# Patient Record
Sex: Female | Born: 1970
Health system: Southern US, Community
[De-identification: ages and names within clinical notes are randomized; demographics above are authoritative.]

## PROBLEM LIST (undated history)

## (undated) HISTORY — PX: ADENOIDECTOMY: SUR15

## (undated) HISTORY — PX: TONSILLECTOMY: SUR1361

---

## 1998-03-08 ENCOUNTER — Inpatient Hospital Stay (HOSPITAL_COMMUNITY): Admission: AD | Admit: 1998-03-08 | Discharge: 1998-03-10 | Payer: Self-pay | Admitting: Obstetrics & Gynecology

## 1998-05-08 ENCOUNTER — Ambulatory Visit (HOSPITAL_BASED_OUTPATIENT_CLINIC_OR_DEPARTMENT_OTHER): Admission: RE | Admit: 1998-05-08 | Discharge: 1998-05-08 | Payer: Self-pay | Admitting: Otolaryngology

## 1998-07-31 ENCOUNTER — Other Ambulatory Visit: Admission: RE | Admit: 1998-07-31 | Discharge: 1998-07-31 | Payer: Self-pay | Admitting: Obstetrics & Gynecology

## 1999-08-19 ENCOUNTER — Other Ambulatory Visit: Admission: RE | Admit: 1999-08-19 | Discharge: 1999-08-19 | Payer: Self-pay | Admitting: Obstetrics & Gynecology

## 2000-09-07 ENCOUNTER — Other Ambulatory Visit: Admission: RE | Admit: 2000-09-07 | Discharge: 2000-09-07 | Payer: Self-pay | Admitting: Obstetrics & Gynecology

## 2001-09-08 ENCOUNTER — Other Ambulatory Visit: Admission: RE | Admit: 2001-09-08 | Discharge: 2001-09-08 | Payer: Self-pay | Admitting: Obstetrics & Gynecology

## 2002-09-15 ENCOUNTER — Inpatient Hospital Stay (HOSPITAL_COMMUNITY): Admission: AD | Admit: 2002-09-15 | Discharge: 2002-09-17 | Payer: Self-pay | Admitting: Obstetrics & Gynecology

## 2002-09-15 ENCOUNTER — Encounter (INDEPENDENT_AMBULATORY_CARE_PROVIDER_SITE_OTHER): Payer: Self-pay | Admitting: Specialist

## 2002-10-26 ENCOUNTER — Other Ambulatory Visit: Admission: RE | Admit: 2002-10-26 | Discharge: 2002-10-26 | Payer: Self-pay | Admitting: Obstetrics & Gynecology

## 2003-10-02 ENCOUNTER — Other Ambulatory Visit: Admission: RE | Admit: 2003-10-02 | Discharge: 2003-10-02 | Payer: Self-pay | Admitting: Obstetrics & Gynecology

## 2004-10-06 ENCOUNTER — Other Ambulatory Visit: Admission: RE | Admit: 2004-10-06 | Discharge: 2004-10-06 | Payer: Self-pay | Admitting: Obstetrics & Gynecology

## 2007-08-04 ENCOUNTER — Emergency Department (HOSPITAL_COMMUNITY): Admission: EM | Admit: 2007-08-04 | Discharge: 2007-08-04 | Payer: Self-pay | Admitting: Family Medicine

## 2010-06-27 ENCOUNTER — Other Ambulatory Visit: Payer: Self-pay | Admitting: Dermatology

## 2010-10-10 NOTE — Discharge Summary (Signed)
NAME:  Shannon Olson, Shannon Olson                   ACCOUNT NO.:  [id number]   MEDICAL RECORD NO.:  [id number]                   PATIENT TYPE:  INP   LOCATION:  9118                                 FACILITY:  [facility]   PHYSICIAN:  Gerrit Friends. Aldona Bar, M.D.                DATE OF BIRTH:  12-Jun-1970   DATE OF ADMISSION:  09/15/2002  DATE OF DISCHARGE:  09/17/2002                                 DISCHARGE SUMMARY   DISCHARGE DIAGNOSES:  1. A 37-week intrauterine pregnancy, delivered 7-pound-4-ounce female     infant, Apgars of 9 and 9.  2. Blood type O positive.  3. Chronic hypertension with superimposed mild preeclampsia.   PROCEDURES:  1. Induction of labor.  2. Normal spontaneous delivery.   SUMMARY:  This gravida 2, para 1 at 37 weeks + with known chronic  hypertension, on [place], was seen in the office on the morning of  09/15/2002 and was felt to possibly have developed superimposed  preeclampsia.  She was sent to the hospital where she arrived at  approximately 11 a.m.  At the time of admission, her cervix was 2 cm dilated  and 50% effaced with a vertex of -2 to -1 station.  She had 1+ edema.  Her  blood pressure in the office was 160/100.   Unfortunately, she was kept in triage until approximately 7:30 p.m.  She was  ultimately transferred to the labor and delivery suite and was seen by me at  7:50 p.m.  Her blood pressures remained elevated and, indeed, went up; her  average pressure was approximately 140/110.  On her side in bed, her  pressures were more normotensive.  There was no hyperreflexia, and the fetal  heart was reactive.  When examined at 7:50 p.m. on 09/15/2002, her cervix  was 3 cm, 50 to 60% effaced with a vertex of -2 to -1 station.  Amniotomy  was carried out, a pressure catheter was placed, and induction was begun.  She had great progression with a subsequent normal spontaneous delivery of a  7-pound-4-ounce female infant with Apgars of 9 and 9 shortly before 11  p.m.  on the evening of 09/15/2002.  She was delivered over an intact perineum.  Placenta was sent to pathology, labeled chronic hypertension.   Her postpartum course was complicated by persistent elevations of her blood  pressure.  On the morning of 09/16/2002, her pressure was 160/100.  Her CBC  revealed a hemoglobin of 12, a white count of 16,700, and a platelet count  of 159,000.  She was started on labetalol; she was given a 300-mg loading  dose followed by 200 mg every eight hours orally.  On the morning of  09/17/2002, her pressures were normal, she was breast feeding without  difficulty, she was ambulating without difficulty, she was requiring minimal  medication for discomfort, her vital signs were stable, and she was deemed  ready for discharge.  Accordingly, she was given  all of her instructions and  was discharged to home.   DISCHARGE MEDICATIONS:  Labetalol 200 mg twice daily.   FOLLOW UP:  Patient will return to the office in approximately two weeks for  a blood pressure check.  As mentioned, she was given all appropriate  instructions at the time of discharge and understands all instructions well.  Regular postpartum visit will be carried out in four weeks' time.    PLAN:  Patient will continue on her prenatal vitamins as long as she is  breast feeding and will use ibuprofen over the counter for discomfort.   CONDITION ON DISCHARGE:  Improved.                                               Gerrit Friends. Aldona Bar, M.D.    RMW/MEDQ  D:  09/17/2002  T:  09/17/2002  Job:  [id number]

## 2011-05-04 ENCOUNTER — Other Ambulatory Visit (HOSPITAL_COMMUNITY): Payer: Self-pay | Admitting: Family Medicine

## 2011-05-04 DIAGNOSIS — IMO0002 Reserved for concepts with insufficient information to code with codable children: Secondary | ICD-10-CM

## 2011-05-07 ENCOUNTER — Ambulatory Visit (HOSPITAL_COMMUNITY)
Admission: RE | Admit: 2011-05-07 | Discharge: 2011-05-07 | Disposition: A | Discharge status: Home / Self Care | Payer: 59 | Source: Ambulatory Visit | Attending: Family Medicine | Admitting: Family Medicine

## 2011-05-07 DIAGNOSIS — IMO0002 Reserved for concepts with insufficient information to code with codable children: Secondary | ICD-10-CM

## 2011-05-07 DIAGNOSIS — R109 Unspecified abdominal pain: Secondary | ICD-10-CM | POA: Insufficient documentation

## 2012-05-03 ENCOUNTER — Other Ambulatory Visit: Payer: Self-pay | Admitting: Obstetrics & Gynecology

## 2013-07-03 ENCOUNTER — Other Ambulatory Visit: Payer: Self-pay | Admitting: Obstetrics & Gynecology

## 2013-07-11 ENCOUNTER — Other Ambulatory Visit: Payer: Self-pay | Admitting: Obstetrics & Gynecology

## 2013-07-11 DIAGNOSIS — R928 Other abnormal and inconclusive findings on diagnostic imaging of breast: Secondary | ICD-10-CM

## 2013-07-21 ENCOUNTER — Ambulatory Visit
Admission: RE | Admit: 2013-07-21 | Discharge: 2013-07-21 | Disposition: A | Discharge status: Home / Self Care | Payer: 59 | Source: Ambulatory Visit | Attending: Obstetrics & Gynecology | Admitting: Obstetrics & Gynecology

## 2013-07-21 DIAGNOSIS — R928 Other abnormal and inconclusive findings on diagnostic imaging of breast: Secondary | ICD-10-CM

## 2014-06-06 ENCOUNTER — Other Ambulatory Visit: Payer: Self-pay

## 2014-06-06 DIAGNOSIS — Z1231 Encounter for screening mammogram for malignant neoplasm of breast: Secondary | ICD-10-CM

## 2014-07-04 ENCOUNTER — Ambulatory Visit
Admission: RE | Admit: 2014-07-04 | Discharge: 2014-07-04 | Disposition: A | Discharge status: Home / Self Care | Payer: 59 | Source: Ambulatory Visit

## 2014-07-04 ENCOUNTER — Other Ambulatory Visit: Payer: Self-pay

## 2014-07-04 DIAGNOSIS — Z1231 Encounter for screening mammogram for malignant neoplasm of breast: Secondary | ICD-10-CM

## 2015-06-12 ENCOUNTER — Other Ambulatory Visit: Payer: Self-pay

## 2015-06-12 DIAGNOSIS — Z1231 Encounter for screening mammogram for malignant neoplasm of breast: Secondary | ICD-10-CM

## 2015-07-12 ENCOUNTER — Ambulatory Visit
Admission: RE | Admit: 2015-07-12 | Discharge: 2015-07-12 | Disposition: A | Discharge status: Home / Self Care | Payer: 59 | Source: Ambulatory Visit

## 2015-07-12 DIAGNOSIS — Z1231 Encounter for screening mammogram for malignant neoplasm of breast: Secondary | ICD-10-CM

## 2015-08-05 ENCOUNTER — Other Ambulatory Visit: Payer: Self-pay | Admitting: Obstetrics & Gynecology

## 2015-08-05 DIAGNOSIS — Z6822 Body mass index (BMI) 22.0-22.9, adult: Secondary | ICD-10-CM | POA: Diagnosis not present

## 2015-08-05 DIAGNOSIS — Z01419 Encounter for gynecological examination (general) (routine) without abnormal findings: Secondary | ICD-10-CM | POA: Diagnosis not present

## 2015-08-05 DIAGNOSIS — Z124 Encounter for screening for malignant neoplasm of cervix: Secondary | ICD-10-CM | POA: Diagnosis not present

## 2015-08-06 LAB — CYTOLOGY - PAP

## 2015-08-16 MED FILL — LISINOPRIL-HCTZ 20-12.5 MG: 20-12.5 | 90 days supply | Qty: 90 | Fill #3

## 2015-10-18 DIAGNOSIS — R05 Cough: Secondary | ICD-10-CM | POA: Diagnosis not present

## 2015-10-18 DIAGNOSIS — J309 Allergic rhinitis, unspecified: Secondary | ICD-10-CM | POA: Diagnosis not present

## 2015-10-18 DIAGNOSIS — I10 Essential (primary) hypertension: Secondary | ICD-10-CM | POA: Diagnosis not present

## 2015-10-18 MED FILL — PROMETHAZINE-DM SYRUP: 6.25-15 | 6 days supply | Qty: 120 | Fill #0

## 2015-10-18 MED FILL — MONTELUKAST SOD 10 MG TAB: 10 | 30 days supply | Qty: 30 | Fill #0

## 2015-11-13 MED FILL — LISINOPRIL-HCTZ 20-12.5 MG: 20-12.5 | 90 days supply | Qty: 90 | Fill #0

## 2016-02-21 MED FILL — LISINOPRIL-HCTZ 20-12.5 MG: 20-12.5 | 90 days supply | Qty: 90 | Fill #1

## 2016-05-12 MED FILL — LISINOPRIL-HCTZ 20-12.5 MG: 20-12.5 | 90 days supply | Qty: 90 | Fill #2

## 2016-06-03 DIAGNOSIS — D485 Neoplasm of uncertain behavior of skin: Secondary | ICD-10-CM | POA: Diagnosis not present

## 2016-06-03 DIAGNOSIS — D2271 Melanocytic nevi of right lower limb, including hip: Secondary | ICD-10-CM | POA: Diagnosis not present

## 2016-06-03 DIAGNOSIS — Z86018 Personal history of other benign neoplasm: Secondary | ICD-10-CM | POA: Diagnosis not present

## 2016-06-03 DIAGNOSIS — L821 Other seborrheic keratosis: Secondary | ICD-10-CM | POA: Diagnosis not present

## 2016-06-03 DIAGNOSIS — Z85828 Personal history of other malignant neoplasm of skin: Secondary | ICD-10-CM | POA: Diagnosis not present

## 2016-06-03 DIAGNOSIS — L814 Other melanin hyperpigmentation: Secondary | ICD-10-CM | POA: Diagnosis not present

## 2016-06-03 DIAGNOSIS — D1801 Hemangioma of skin and subcutaneous tissue: Secondary | ICD-10-CM | POA: Diagnosis not present

## 2016-06-03 DIAGNOSIS — D225 Melanocytic nevi of trunk: Secondary | ICD-10-CM | POA: Diagnosis not present

## 2016-06-23 ENCOUNTER — Telehealth: Payer: 59 | Admitting: Nurse Practitioner

## 2016-06-23 DIAGNOSIS — N3001 Acute cystitis with hematuria: Secondary | ICD-10-CM

## 2016-06-23 MED ORDER — NITROFURANTOIN MONOHYD MACRO 100 MG PO CAPS: 100.0000 mg | ORAL_CAPSULE | Freq: Two times a day (BID) | ORAL | 0 refills | Status: DC

## 2016-06-23 NOTE — Progress Notes (Signed)

## 2016-08-10 ENCOUNTER — Other Ambulatory Visit: Payer: Self-pay | Admitting: Obstetrics & Gynecology

## 2016-08-10 DIAGNOSIS — Z1231 Encounter for screening mammogram for malignant neoplasm of breast: Secondary | ICD-10-CM | POA: Diagnosis not present

## 2016-08-10 DIAGNOSIS — Z124 Encounter for screening for malignant neoplasm of cervix: Secondary | ICD-10-CM | POA: Diagnosis not present

## 2016-08-10 DIAGNOSIS — Z01419 Encounter for gynecological examination (general) (routine) without abnormal findings: Secondary | ICD-10-CM | POA: Diagnosis not present

## 2016-08-11 LAB — CYTOLOGY - PAP

## 2016-08-13 MED FILL — LISINOPRIL-HCTZ 20-12.5 MG: 20-12.5 | 90 days supply | Qty: 90 | Fill #3

## 2016-10-21 DIAGNOSIS — Z13220 Encounter for screening for lipoid disorders: Secondary | ICD-10-CM | POA: Diagnosis not present

## 2016-10-21 DIAGNOSIS — J309 Allergic rhinitis, unspecified: Secondary | ICD-10-CM | POA: Diagnosis not present

## 2016-10-21 DIAGNOSIS — I10 Essential (primary) hypertension: Secondary | ICD-10-CM | POA: Diagnosis not present

## 2016-10-21 DIAGNOSIS — Z23 Encounter for immunization: Secondary | ICD-10-CM | POA: Diagnosis not present

## 2016-11-19 MED FILL — LISINOPRIL-HCTZ 20-12.5 MG: 20-12.5 | 90 days supply | Qty: 90 | Fill #0

## 2017-01-11 ENCOUNTER — Telehealth: Payer: 59 | Admitting: Family

## 2017-01-11 DIAGNOSIS — J019 Acute sinusitis, unspecified: Secondary | ICD-10-CM

## 2017-01-11 MED ORDER — FLUTICASONE PROPIONATE 50 MCG/ACT NA SUSP: 2.0000 | NASAL | 6 refills | Status: DC

## 2017-01-11 MED ORDER — PREDNISONE 5 MG PO TABS: 5.0000 mg | ORAL_TABLET | ORAL | 0 refills | Status: AC

## 2017-01-11 NOTE — Progress Notes (Signed)
Thank you for the details you put in the comment boxes. Those details really help [place] take better care of you. It sounds like the infection is severe. However, nearly 90% of these infections are viral. The new evidence-based-practice we use is that if it is worsening at the 5 days point or exceeding 5 days, we will go with antibiotics, but not before that point in time. Your E-visit indicates it is approximately 3 days, but I wanted to clarify with you if any of these symptoms had been 5 days at this point. If so, it would change our course of treatment. I am treating this as viral for now, but please let us know if you are at or beyond 5 days and I will change the treatment plan.   We are sorry that you are not feeling well.  Here is how we plan to help!  Based on what you have shared with me it looks like you have sinusitis.  Sinusitis is inflammation and infection in the sinus cavities of the head.  Based on your presentation I believe you most likely have Acute Viral Sinusitis.This is an infection most likely caused by a virus. There is not specific treatment for viral sinusitis other than to help you with the symptoms until the infection runs its course.  You may use an oral decongestant such as Mucinex D or if you have glaucoma or high blood pressure use plain Mucinex. Saline nasal spray help and can safely be used as often as needed for congestion, I have prescribed: Fluticasone nasal spray two sprays in each nostril twice a day   I am also prescribing a prednisone pack (5mg  6 day taper)  Some authorities believe that zinc sprays or the use of Echinacea may shorten the course of your symptoms.  Sinus infections are not as easily transmitted as other respiratory infection, however we still recommend that you avoid close contact with loved ones, especially the very young and elderly.  Remember to wash your hands thoroughly throughout the day as this is the number one way to prevent the spread of  infection!  Home Care:  Only take medications as instructed by your medical team.  Complete the entire course of an antibiotic.  Do not take these medications with alcohol.  A steam or ultrasonic humidifier can help congestion.  You can place a towel over your head and breathe in the steam from hot water coming from a faucet.  Avoid close contacts especially the very young and the elderly.  Cover your mouth when you cough or sneeze.  Always remember to wash your hands.  Get Help Right Away If:  You develop worsening fever or sinus pain.  You develop a severe head ache or visual changes.  Your symptoms persist after you have completed your treatment plan.  Make sure you  Understand these instructions.  Will watch your condition.  Will get help right away if you are not doing well or get worse.  Your e-visit answers were reviewed by a board certified advanced clinical practitioner to complete your personal care plan.  Depending on the condition, your plan could have included both over the counter or prescription medications.  If there is a problem please reply  once you have received a response from your provider.  Your safety is important to [place].  If you have drug allergies check your prescription carefully.    You can use MyChart to ask questions about today's visit, request a non-urgent call back,  or ask for a work or school excuse for 24 hours related to this e-Visit. If it has been greater than 24 hours you will need to follow up with your provider, or enter a new e-Visit to address those concerns.  You will get an e-mail in the next two days asking about your experience.  I hope that your e-visit has been valuable and will speed your recovery. Thank you for using e-visits.

## 2017-02-10 MED FILL — LISINOPRIL-HCTZ 20-12.5 MG: 20-12.5 | 90 days supply | Qty: 90 | Fill #1

## 2017-04-09 DIAGNOSIS — H5213 Myopia, bilateral: Secondary | ICD-10-CM | POA: Diagnosis not present

## 2017-05-06 MED FILL — LISINOPRIL-HCTZ 20-12.5 MG: 20-12.5 | 90 days supply | Qty: 90 | Fill #2

## 2017-06-09 DIAGNOSIS — Z85828 Personal history of other malignant neoplasm of skin: Secondary | ICD-10-CM | POA: Diagnosis not present

## 2017-06-09 DIAGNOSIS — L814 Other melanin hyperpigmentation: Secondary | ICD-10-CM | POA: Diagnosis not present

## 2017-06-09 DIAGNOSIS — D1801 Hemangioma of skin and subcutaneous tissue: Secondary | ICD-10-CM | POA: Diagnosis not present

## 2017-06-09 DIAGNOSIS — Z23 Encounter for immunization: Secondary | ICD-10-CM | POA: Diagnosis not present

## 2017-06-09 DIAGNOSIS — L821 Other seborrheic keratosis: Secondary | ICD-10-CM | POA: Diagnosis not present

## 2017-06-09 DIAGNOSIS — Z86018 Personal history of other benign neoplasm: Secondary | ICD-10-CM | POA: Diagnosis not present

## 2017-06-09 DIAGNOSIS — D225 Melanocytic nevi of trunk: Secondary | ICD-10-CM | POA: Diagnosis not present

## 2017-08-06 DIAGNOSIS — I10 Essential (primary) hypertension: Secondary | ICD-10-CM | POA: Diagnosis not present

## 2017-08-06 MED FILL — LISINOPRIL-HCTZ 20-12.5 MG: 20-12.5 | 45 days supply | Qty: 90 | Fill #0

## 2017-08-17 DIAGNOSIS — Z01419 Encounter for gynecological examination (general) (routine) without abnormal findings: Secondary | ICD-10-CM | POA: Diagnosis not present

## 2017-08-17 DIAGNOSIS — Z1231 Encounter for screening mammogram for malignant neoplasm of breast: Secondary | ICD-10-CM | POA: Diagnosis not present

## 2017-09-03 DIAGNOSIS — I10 Essential (primary) hypertension: Secondary | ICD-10-CM | POA: Diagnosis not present

## 2017-09-03 DIAGNOSIS — J309 Allergic rhinitis, unspecified: Secondary | ICD-10-CM | POA: Diagnosis not present

## 2017-09-03 DIAGNOSIS — F430 Acute stress reaction: Secondary | ICD-10-CM | POA: Diagnosis not present

## 2017-09-03 MED FILL — MONTELUKAST SOD 10 MG TAB: 10 | 90 days supply | Qty: 90 | Fill #0

## 2017-10-01 DIAGNOSIS — I10 Essential (primary) hypertension: Secondary | ICD-10-CM | POA: Diagnosis not present

## 2017-10-01 DIAGNOSIS — J309 Allergic rhinitis, unspecified: Secondary | ICD-10-CM | POA: Diagnosis not present

## 2017-10-01 DIAGNOSIS — F430 Acute stress reaction: Secondary | ICD-10-CM | POA: Diagnosis not present

## 2017-10-27 MED FILL — LISINOPRIL-HCTZ 20-12.5 MG: 20-12.5 | 45 days supply | Qty: 90 | Fill #1

## 2018-01-12 DIAGNOSIS — M542 Cervicalgia: Secondary | ICD-10-CM | POA: Diagnosis not present

## 2018-01-12 DIAGNOSIS — M79601 Pain in right arm: Secondary | ICD-10-CM | POA: Diagnosis not present

## 2018-01-19 DIAGNOSIS — M79601 Pain in right arm: Secondary | ICD-10-CM | POA: Diagnosis not present

## 2018-01-19 DIAGNOSIS — M542 Cervicalgia: Secondary | ICD-10-CM | POA: Diagnosis not present

## 2018-01-26 DIAGNOSIS — M542 Cervicalgia: Secondary | ICD-10-CM | POA: Diagnosis not present

## 2018-01-26 DIAGNOSIS — M79601 Pain in right arm: Secondary | ICD-10-CM | POA: Diagnosis not present

## 2018-02-10 MED FILL — AMLODIPINE BESYLATE 5 MG TA: 5 | 30 days supply | Qty: 30 | Fill #0

## 2018-02-10 MED FILL — LISINOPRIL-HCTZ 20-12.5 MG: 20-12.5 | 45 days supply | Qty: 90 | Fill #2

## 2018-02-16 DIAGNOSIS — M79601 Pain in right arm: Secondary | ICD-10-CM | POA: Diagnosis not present

## 2018-02-16 DIAGNOSIS — M542 Cervicalgia: Secondary | ICD-10-CM | POA: Diagnosis not present

## 2018-03-02 DIAGNOSIS — M542 Cervicalgia: Secondary | ICD-10-CM | POA: Diagnosis not present

## 2018-03-02 DIAGNOSIS — M79601 Pain in right arm: Secondary | ICD-10-CM | POA: Diagnosis not present

## 2018-03-11 DIAGNOSIS — M542 Cervicalgia: Secondary | ICD-10-CM | POA: Diagnosis not present

## 2018-03-11 DIAGNOSIS — M79601 Pain in right arm: Secondary | ICD-10-CM | POA: Diagnosis not present

## 2018-03-22 MED FILL — AMLODIPINE BESYLATE 5 MG TA: 5 | 90 days supply | Qty: 90 | Fill #0

## 2018-04-20 DIAGNOSIS — I10 Essential (primary) hypertension: Secondary | ICD-10-CM | POA: Diagnosis not present

## 2018-04-20 DIAGNOSIS — J309 Allergic rhinitis, unspecified: Secondary | ICD-10-CM | POA: Diagnosis not present

## 2018-04-20 DIAGNOSIS — D1723 Benign lipomatous neoplasm of skin and subcutaneous tissue of right leg: Secondary | ICD-10-CM | POA: Diagnosis not present

## 2018-05-12 MED FILL — LISINOPRIL-HCTZ 20-12.5 MG: 20-12.5 | 45 days supply | Qty: 90 | Fill #3

## 2018-05-27 DIAGNOSIS — D225 Melanocytic nevi of trunk: Secondary | ICD-10-CM | POA: Diagnosis not present

## 2018-05-27 DIAGNOSIS — Z85828 Personal history of other malignant neoplasm of skin: Secondary | ICD-10-CM | POA: Diagnosis not present

## 2018-05-27 DIAGNOSIS — L814 Other melanin hyperpigmentation: Secondary | ICD-10-CM | POA: Diagnosis not present

## 2018-05-27 DIAGNOSIS — B078 Other viral warts: Secondary | ICD-10-CM | POA: Diagnosis not present

## 2018-05-27 DIAGNOSIS — L821 Other seborrheic keratosis: Secondary | ICD-10-CM | POA: Diagnosis not present

## 2018-05-27 DIAGNOSIS — L309 Dermatitis, unspecified: Secondary | ICD-10-CM | POA: Diagnosis not present

## 2018-05-27 DIAGNOSIS — Z86018 Personal history of other benign neoplasm: Secondary | ICD-10-CM | POA: Diagnosis not present

## 2018-05-27 DIAGNOSIS — Z23 Encounter for immunization: Secondary | ICD-10-CM | POA: Diagnosis not present

## 2018-06-23 MED FILL — AMLODIPINE BESYLATE 5 MG TA: 5 | 90 days supply | Qty: 90 | Fill #1

## 2018-08-03 MED FILL — LISINOPRIL-HCTZ 20-12.5 MG: 20-12.5 | 90 days supply | Qty: 90 | Fill #0

## 2018-08-11 MED FILL — MONTELUKAST SOD 10 MG TAB: 10 | 90 days supply | Qty: 90 | Fill #1

## 2018-10-11 DIAGNOSIS — Z01419 Encounter for gynecological examination (general) (routine) without abnormal findings: Secondary | ICD-10-CM | POA: Diagnosis not present

## 2018-10-11 DIAGNOSIS — Z124 Encounter for screening for malignant neoplasm of cervix: Secondary | ICD-10-CM | POA: Diagnosis not present

## 2018-10-11 DIAGNOSIS — Z1231 Encounter for screening mammogram for malignant neoplasm of breast: Secondary | ICD-10-CM | POA: Diagnosis not present

## 2018-10-13 ENCOUNTER — Other Ambulatory Visit: Payer: Self-pay | Admitting: Obstetrics & Gynecology

## 2018-10-13 DIAGNOSIS — R928 Other abnormal and inconclusive findings on diagnostic imaging of breast: Secondary | ICD-10-CM

## 2018-10-20 ENCOUNTER — Ambulatory Visit
Admission: RE | Admit: 2018-10-20 | Discharge: 2018-10-20 | Disposition: A | Discharge status: Home / Self Care | Payer: 59 | Source: Ambulatory Visit | Attending: Obstetrics & Gynecology | Admitting: Obstetrics & Gynecology

## 2018-10-20 ENCOUNTER — Other Ambulatory Visit: Payer: Self-pay | Admitting: Obstetrics & Gynecology

## 2018-10-20 ENCOUNTER — Other Ambulatory Visit: Payer: Self-pay

## 2018-10-20 DIAGNOSIS — R928 Other abnormal and inconclusive findings on diagnostic imaging of breast: Secondary | ICD-10-CM

## 2018-10-20 DIAGNOSIS — R922 Inconclusive mammogram: Secondary | ICD-10-CM | POA: Diagnosis not present

## 2018-10-20 DIAGNOSIS — N6310 Unspecified lump in the right breast, unspecified quadrant: Secondary | ICD-10-CM

## 2018-10-20 DIAGNOSIS — N6459 Other signs and symptoms in breast: Secondary | ICD-10-CM | POA: Diagnosis not present

## 2018-10-26 ENCOUNTER — Ambulatory Visit
Admission: RE | Admit: 2018-10-26 | Discharge: 2018-10-26 | Disposition: A | Discharge status: Home / Self Care | Payer: 59 | Source: Ambulatory Visit | Attending: Obstetrics & Gynecology | Admitting: Obstetrics & Gynecology

## 2018-10-26 ENCOUNTER — Other Ambulatory Visit: Payer: Self-pay

## 2018-10-26 DIAGNOSIS — N6310 Unspecified lump in the right breast, unspecified quadrant: Secondary | ICD-10-CM

## 2018-10-26 DIAGNOSIS — R928 Other abnormal and inconclusive findings on diagnostic imaging of breast: Secondary | ICD-10-CM | POA: Diagnosis not present

## 2018-10-26 DIAGNOSIS — N649 Disorder of breast, unspecified: Secondary | ICD-10-CM | POA: Diagnosis not present

## 2018-11-01 MED FILL — LISINOPRIL-HCTZ 20-12.5 MG: 20-12.5 | 90 days supply | Qty: 90 | Fill #0

## 2018-11-01 MED FILL — MONTELUKAST SOD 10 MG TAB: 10 | 90 days supply | Qty: 90 | Fill #0

## 2019-01-11 DIAGNOSIS — I10 Essential (primary) hypertension: Secondary | ICD-10-CM | POA: Diagnosis not present

## 2019-01-11 DIAGNOSIS — Z13220 Encounter for screening for lipoid disorders: Secondary | ICD-10-CM | POA: Diagnosis not present

## 2019-01-11 DIAGNOSIS — J309 Allergic rhinitis, unspecified: Secondary | ICD-10-CM | POA: Diagnosis not present

## 2019-01-11 DIAGNOSIS — Z0000 Encounter for general adult medical examination without abnormal findings: Secondary | ICD-10-CM | POA: Diagnosis not present

## 2019-01-12 MED FILL — LISINOPRIL-HCTZ 20-12.5 MG: 20-12.5 | 90 days supply | Qty: 90 | Fill #0

## 2019-01-12 MED FILL — MONTELUKAST SOD 10 MG TAB: 10 | 90 days supply | Qty: 90 | Fill #0

## 2019-05-03 MED FILL — LISINOPRIL-HCTZ 20-12.5 MG: 20-12.5 | 90 days supply | Qty: 90 | Fill #1

## 2019-05-31 DIAGNOSIS — L821 Other seborrheic keratosis: Secondary | ICD-10-CM | POA: Diagnosis not present

## 2019-05-31 DIAGNOSIS — L814 Other melanin hyperpigmentation: Secondary | ICD-10-CM | POA: Diagnosis not present

## 2019-05-31 DIAGNOSIS — Z85828 Personal history of other malignant neoplasm of skin: Secondary | ICD-10-CM | POA: Diagnosis not present

## 2019-05-31 DIAGNOSIS — Z23 Encounter for immunization: Secondary | ICD-10-CM | POA: Diagnosis not present

## 2019-05-31 DIAGNOSIS — Z86018 Personal history of other benign neoplasm: Secondary | ICD-10-CM | POA: Diagnosis not present

## 2019-05-31 DIAGNOSIS — D225 Melanocytic nevi of trunk: Secondary | ICD-10-CM | POA: Diagnosis not present

## 2019-07-21 ENCOUNTER — Other Ambulatory Visit (HOSPITAL_COMMUNITY): Payer: Self-pay | Admitting: Family Medicine

## 2019-07-21 DIAGNOSIS — J309 Allergic rhinitis, unspecified: Secondary | ICD-10-CM | POA: Diagnosis not present

## 2019-07-21 DIAGNOSIS — I10 Essential (primary) hypertension: Secondary | ICD-10-CM | POA: Diagnosis not present

## 2019-07-21 DIAGNOSIS — Z1211 Encounter for screening for malignant neoplasm of colon: Secondary | ICD-10-CM | POA: Diagnosis not present

## 2019-07-21 MED FILL — AMLODIPINE BESYLATE 5 MG TA: 5 | 90 days supply | Qty: 90 | Fill #0

## 2019-07-21 MED FILL — LISINOPRIL-HCTZ 20-12.5 MG: 20-12.5 | 90 days supply | Qty: 90 | Fill #0

## 2019-07-21 MED FILL — MONTELUKAST SOD 10 MG TAB: 10 | 90 days supply | Qty: 90 | Fill #0

## 2019-10-27 MED FILL — AMLODIPINE BESYLATE 5 MG TA: 5 | 90 days supply | Qty: 90 | Fill #1

## 2019-10-27 MED FILL — LISINOPRIL-HCTZ 20-12.5 MG: 20-12.5 | 90 days supply | Qty: 90 | Fill #1

## 2019-12-12 DIAGNOSIS — Z01419 Encounter for gynecological examination (general) (routine) without abnormal findings: Secondary | ICD-10-CM | POA: Diagnosis not present

## 2019-12-12 DIAGNOSIS — Z1231 Encounter for screening mammogram for malignant neoplasm of breast: Secondary | ICD-10-CM | POA: Diagnosis not present

## 2019-12-13 DIAGNOSIS — Z1211 Encounter for screening for malignant neoplasm of colon: Secondary | ICD-10-CM | POA: Diagnosis not present

## 2020-01-15 IMAGING — MG STEREOTACTIC VACUUM ASSIST RIGHT
8 series · 8 of 8 positions shown · non-contrast
Comparison: Previous exams.
COMPARISON: Previous exams.

Addendum:
CLINICAL DATA: Small developing asymmetry with possible subtle
architectural distortion in the upper right breast at recent
mammography. No sonographic correlate.

EXAM:
RIGHT BREAST STEREOTACTIC CORE NEEDLE BIOPSY

[R (1 of 8)]
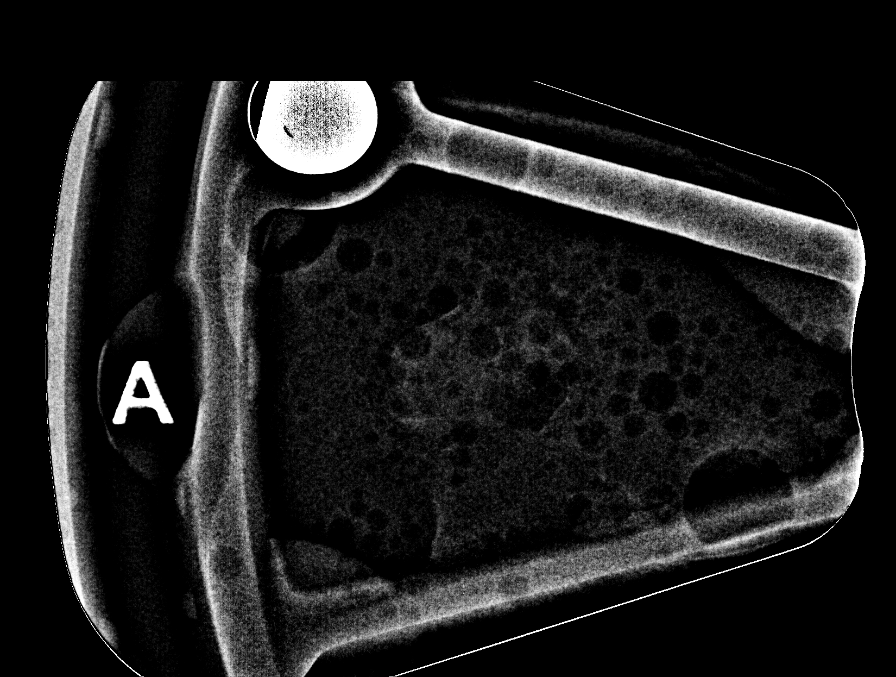

[R (2 of 8)]
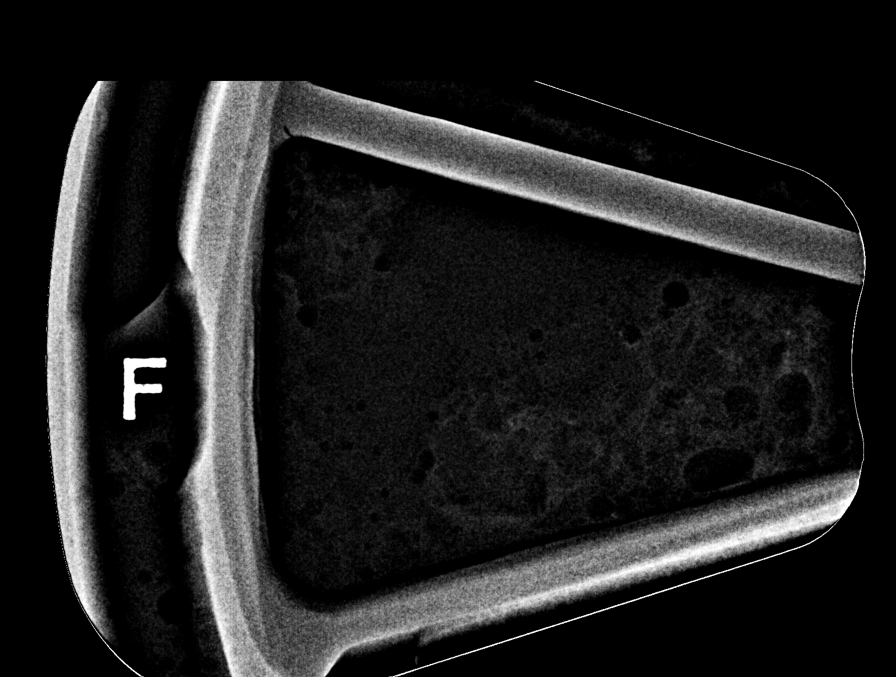

[R (3 of 8)]
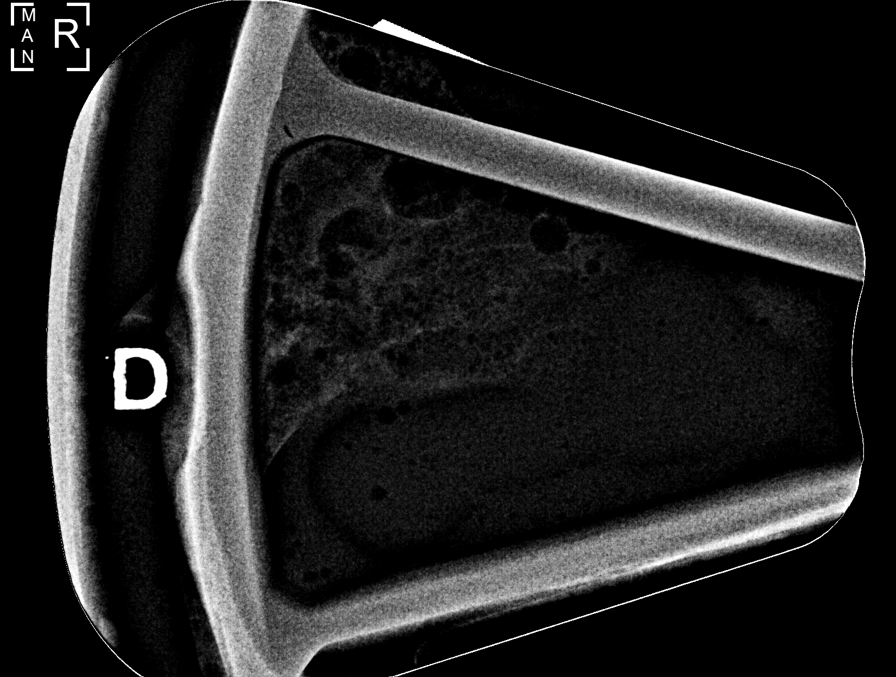

[R (4 of 8)]
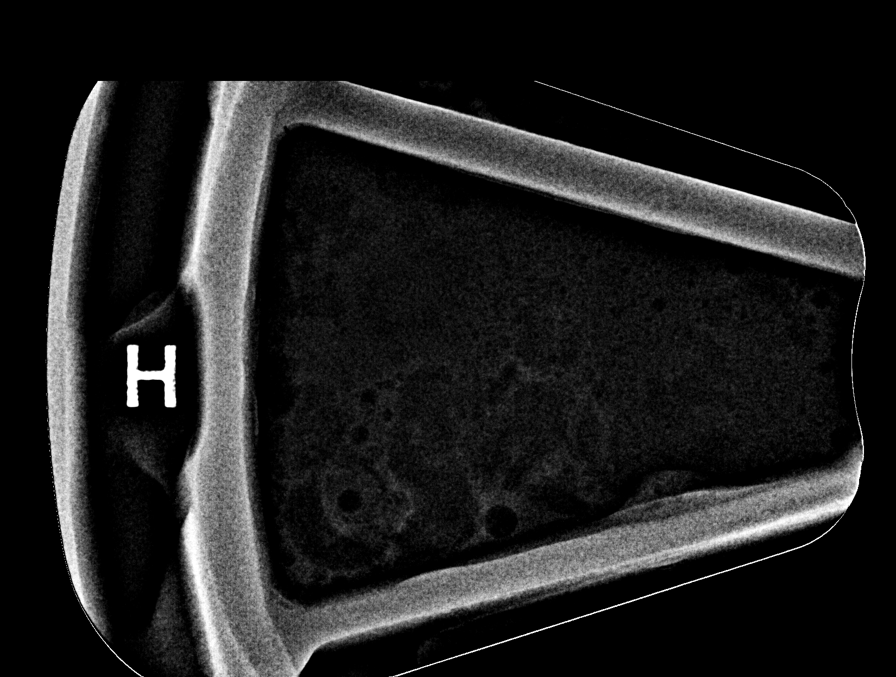

[R (5 of 8)]
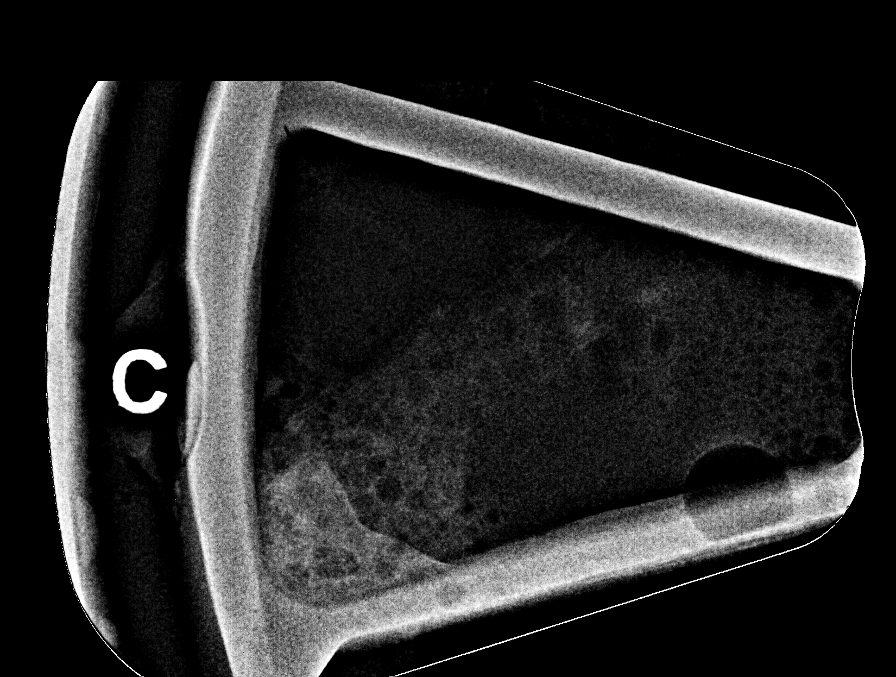

[R (6 of 8)]
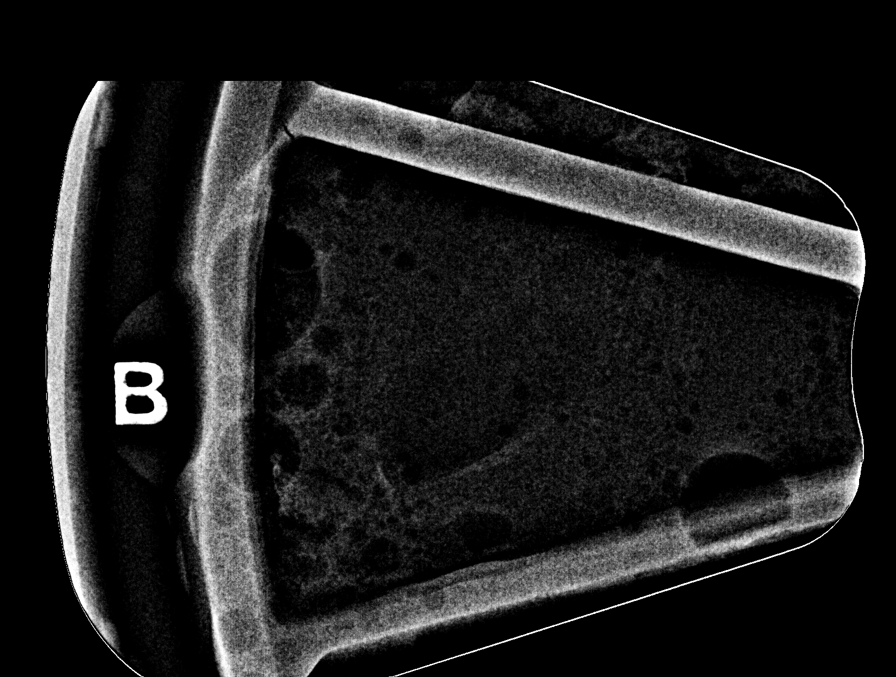

[R (7 of 8)]
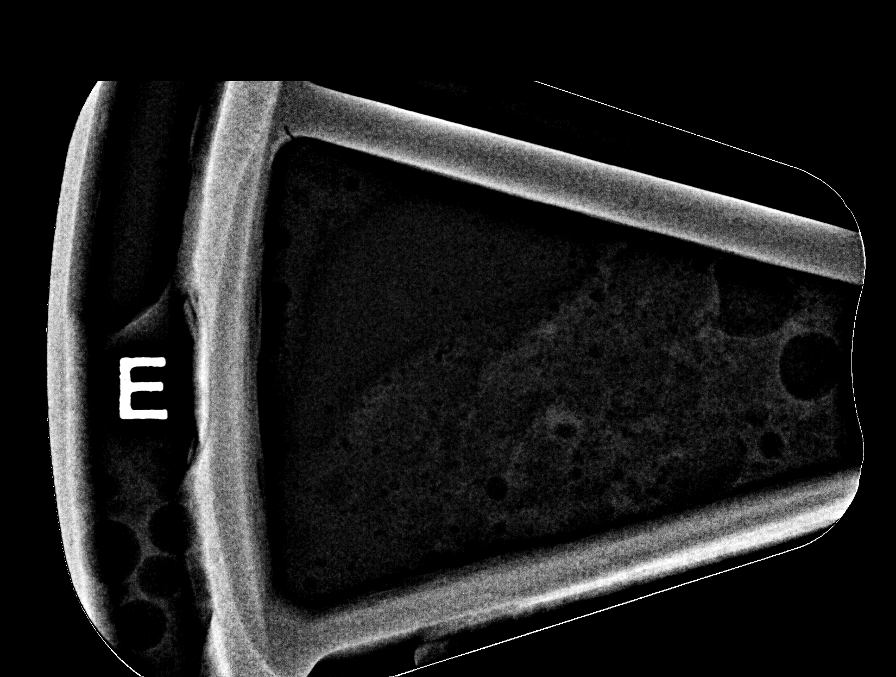

[R (8 of 8)]
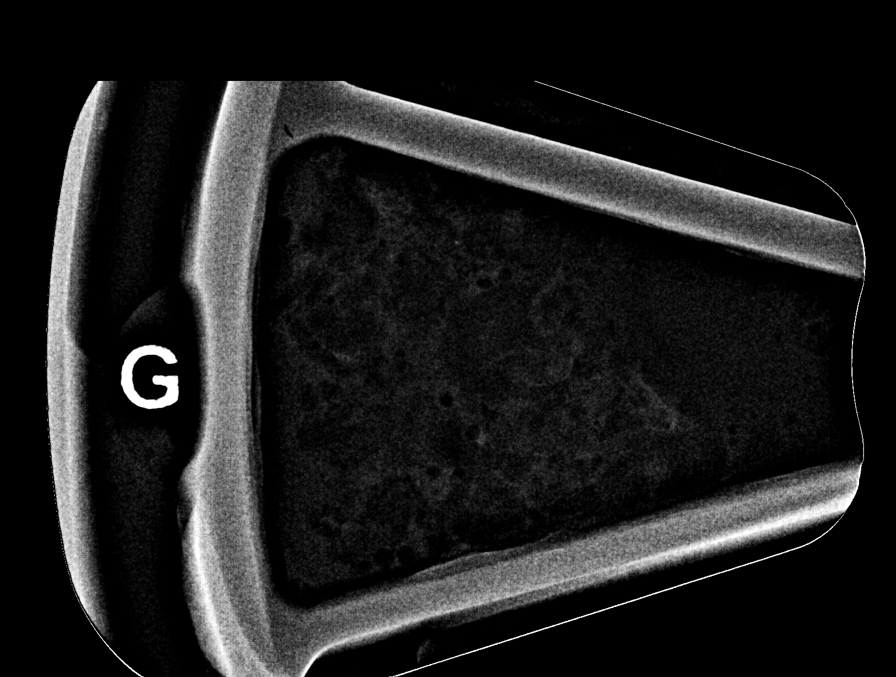

[8 of 8 positions shown; findings below may reference images not displayed]



Using sterile technique and 1% Lidocaine as local anesthetic, under
stereotactic guidance, a 9 gauge vacuum assisted device was used to
perform core needle biopsy of the recently demonstrated small
asymmetry in the upper right breast using a lateral approach.

At the conclusion of the procedure, a coil shaped tissue marker clip
was deployed into the biopsy cavity. Follow-up 2-view mammogram was
performed and dictated separately.
IMPRESSION: Stereotactic-guided biopsy of the recently demonstrated small
asymmetry in the upper right breast. No apparent complications.

ADDENDUM:
Pathology revealed BENIGN LYMPH NODE of the Right breast, upper.
This was found to be concordant by Dr. Kamedu Horsthemke.

Pathology results were discussed with the patient by telephone. The
patient reported doing well after the biopsy with tenderness and
minimal bleeding at the site. Post biopsy instructions and care were
reviewed and questions were answered. The patient was encouraged to
call The [REDACTED] for any additional
concerns.

The patient was instructed to return for annual screening
mammography at [HOSPITAL[facility].

Pathology results reported by Parcidio Pyrrait, RN on 10/27/2018.



Using sterile technique and 1% Lidocaine as local anesthetic, under
stereotactic guidance, a 9 gauge vacuum assisted device was used to
perform core needle biopsy of the recently demonstrated small
asymmetry in the upper right breast using a lateral approach.

At the conclusion of the procedure, a coil shaped tissue marker clip
was deployed into the biopsy cavity. Follow-up 2-view mammogram was
performed and dictated separately.
IMPRESSION: Stereotactic-guided biopsy of the recently demonstrated small
asymmetry in the upper right breast. No apparent complications.

## 2020-01-25 MED FILL — AMLODIPINE BESYLATE 5 MG TA: 5 | 90 days supply | Qty: 90 | Fill #2

## 2020-01-25 MED FILL — LISINOPRIL-HCTZ 20-12.5 MG: 20-12.5 | 90 days supply | Qty: 90 | Fill #2

## 2020-01-31 ENCOUNTER — Other Ambulatory Visit (HOSPITAL_COMMUNITY): Payer: Self-pay | Admitting: Family Medicine

## 2020-01-31 DIAGNOSIS — Z0000 Encounter for general adult medical examination without abnormal findings: Secondary | ICD-10-CM | POA: Diagnosis not present

## 2020-01-31 DIAGNOSIS — J309 Allergic rhinitis, unspecified: Secondary | ICD-10-CM | POA: Diagnosis not present

## 2020-01-31 DIAGNOSIS — I10 Essential (primary) hypertension: Secondary | ICD-10-CM | POA: Diagnosis not present

## 2020-01-31 DIAGNOSIS — Z13220 Encounter for screening for lipoid disorders: Secondary | ICD-10-CM | POA: Diagnosis not present

## 2020-02-14 DIAGNOSIS — M79601 Pain in right arm: Secondary | ICD-10-CM | POA: Diagnosis not present

## 2020-02-14 DIAGNOSIS — M542 Cervicalgia: Secondary | ICD-10-CM | POA: Diagnosis not present

## 2020-02-28 DIAGNOSIS — M79601 Pain in right arm: Secondary | ICD-10-CM | POA: Diagnosis not present

## 2020-02-28 DIAGNOSIS — M542 Cervicalgia: Secondary | ICD-10-CM | POA: Diagnosis not present

## 2020-03-06 DIAGNOSIS — M542 Cervicalgia: Secondary | ICD-10-CM | POA: Diagnosis not present

## 2020-03-06 DIAGNOSIS — M79601 Pain in right arm: Secondary | ICD-10-CM | POA: Diagnosis not present

## 2020-03-13 DIAGNOSIS — M79601 Pain in right arm: Secondary | ICD-10-CM | POA: Diagnosis not present

## 2020-03-13 DIAGNOSIS — M542 Cervicalgia: Secondary | ICD-10-CM | POA: Diagnosis not present

## 2020-03-22 MED FILL — MONTELUKAST SOD 10 MG TAB: 10 | 90 days supply | Qty: 90 | Fill #0

## 2020-03-27 DIAGNOSIS — M542 Cervicalgia: Secondary | ICD-10-CM | POA: Diagnosis not present

## 2020-03-27 DIAGNOSIS — M79601 Pain in right arm: Secondary | ICD-10-CM | POA: Diagnosis not present

## 2020-04-03 DIAGNOSIS — M79601 Pain in right arm: Secondary | ICD-10-CM | POA: Diagnosis not present

## 2020-04-03 DIAGNOSIS — M542 Cervicalgia: Secondary | ICD-10-CM | POA: Diagnosis not present

## 2020-04-16 MED FILL — LISINOPRIL-HCTZ 20-12.5 MG: 20-12.5 | 90 days supply | Qty: 90 | Fill #3

## 2020-04-17 DIAGNOSIS — M542 Cervicalgia: Secondary | ICD-10-CM | POA: Diagnosis not present

## 2020-04-17 DIAGNOSIS — M79601 Pain in right arm: Secondary | ICD-10-CM | POA: Diagnosis not present

## 2020-04-26 MED FILL — AMLODIPINE BESYLATE 5 MG TA: 5 | 90 days supply | Qty: 90 | Fill #3

## 2020-05-01 DIAGNOSIS — M79601 Pain in right arm: Secondary | ICD-10-CM | POA: Diagnosis not present

## 2020-05-01 DIAGNOSIS — M542 Cervicalgia: Secondary | ICD-10-CM | POA: Diagnosis not present

## 2020-05-08 DIAGNOSIS — M79601 Pain in right arm: Secondary | ICD-10-CM | POA: Diagnosis not present

## 2020-05-08 DIAGNOSIS — M542 Cervicalgia: Secondary | ICD-10-CM | POA: Diagnosis not present

## 2020-05-22 DIAGNOSIS — M79601 Pain in right arm: Secondary | ICD-10-CM | POA: Diagnosis not present

## 2020-05-22 DIAGNOSIS — M542 Cervicalgia: Secondary | ICD-10-CM | POA: Diagnosis not present

## 2020-06-05 DIAGNOSIS — Z86018 Personal history of other benign neoplasm: Secondary | ICD-10-CM | POA: Diagnosis not present

## 2020-06-05 DIAGNOSIS — M79601 Pain in right arm: Secondary | ICD-10-CM | POA: Diagnosis not present

## 2020-06-05 DIAGNOSIS — L821 Other seborrheic keratosis: Secondary | ICD-10-CM | POA: Diagnosis not present

## 2020-06-05 DIAGNOSIS — Z85828 Personal history of other malignant neoplasm of skin: Secondary | ICD-10-CM | POA: Diagnosis not present

## 2020-06-05 DIAGNOSIS — M542 Cervicalgia: Secondary | ICD-10-CM | POA: Diagnosis not present

## 2020-06-05 DIAGNOSIS — L578 Other skin changes due to chronic exposure to nonionizing radiation: Secondary | ICD-10-CM | POA: Diagnosis not present

## 2020-06-05 DIAGNOSIS — L814 Other melanin hyperpigmentation: Secondary | ICD-10-CM | POA: Diagnosis not present

## 2020-06-05 DIAGNOSIS — D225 Melanocytic nevi of trunk: Secondary | ICD-10-CM | POA: Diagnosis not present

## 2020-06-14 MED FILL — MONTELUKAST SOD 10 MG TAB: 10 | 90 days supply | Qty: 90 | Fill #1

## 2020-07-18 MED FILL — LISINOPRIL-HCTZ 20-12.5 MG: 20-12.5 | 90 days supply | Qty: 90 | Fill #0

## 2020-07-18 MED FILL — AMLODIPINE BESYLATE 5 MG TA: 5 | 90 days supply | Qty: 90 | Fill #0

## 2020-07-31 ENCOUNTER — Other Ambulatory Visit (HOSPITAL_COMMUNITY): Payer: Self-pay | Admitting: Family Medicine

## 2020-07-31 DIAGNOSIS — Z23 Encounter for immunization: Secondary | ICD-10-CM | POA: Diagnosis not present

## 2020-07-31 DIAGNOSIS — I10 Essential (primary) hypertension: Secondary | ICD-10-CM | POA: Diagnosis not present

## 2020-07-31 DIAGNOSIS — R0982 Postnasal drip: Secondary | ICD-10-CM | POA: Diagnosis not present

## 2020-07-31 DIAGNOSIS — K219 Gastro-esophageal reflux disease without esophagitis: Secondary | ICD-10-CM | POA: Diagnosis not present

## 2020-07-31 DIAGNOSIS — J309 Allergic rhinitis, unspecified: Secondary | ICD-10-CM | POA: Diagnosis not present

## 2020-07-31 MED FILL — AZELASTINE HCL 137 MCG SPRY: 0.1 | 25 days supply | Qty: 30 | Fill #0

## 2020-08-14 ENCOUNTER — Other Ambulatory Visit (HOSPITAL_COMMUNITY): Payer: Self-pay | Admitting: Family Medicine

## 2020-08-14 MED FILL — LISINOPRIL-HCTZ 20-25 MG TA: 20-25 | 90 days supply | Qty: 90 | Fill #0

## 2020-09-11 ENCOUNTER — Other Ambulatory Visit (HOSPITAL_COMMUNITY): Payer: Self-pay

## 2020-09-13 ENCOUNTER — Other Ambulatory Visit (HOSPITAL_COMMUNITY): Payer: Self-pay

## 2020-09-13 MED ORDER — MONTELUKAST SODIUM 10 MG PO TABS
10.0000 mg | ORAL_TABLET | Freq: Every day | ORAL | 1 refills | Status: DC
  Filled 2020-09-13: qty 90, 90d supply, fill #0
  Filled 2020-12-26: qty 90, 90d supply, fill #1

## 2020-09-18 ENCOUNTER — Other Ambulatory Visit (HOSPITAL_COMMUNITY): Payer: Self-pay

## 2020-09-18 DIAGNOSIS — H52203 Unspecified astigmatism, bilateral: Secondary | ICD-10-CM | POA: Diagnosis not present

## 2020-09-18 DIAGNOSIS — H5213 Myopia, bilateral: Secondary | ICD-10-CM | POA: Diagnosis not present

## 2020-09-25 ENCOUNTER — Other Ambulatory Visit (HOSPITAL_COMMUNITY): Payer: Self-pay

## 2020-09-25 DIAGNOSIS — I10 Essential (primary) hypertension: Secondary | ICD-10-CM | POA: Diagnosis not present

## 2020-09-25 DIAGNOSIS — H6980 Other specified disorders of Eustachian tube, unspecified ear: Secondary | ICD-10-CM | POA: Diagnosis not present

## 2020-09-25 DIAGNOSIS — H1010 Acute atopic conjunctivitis, unspecified eye: Secondary | ICD-10-CM | POA: Diagnosis not present

## 2020-10-15 ENCOUNTER — Ambulatory Visit: Payer: 59 | Attending: Internal Medicine

## 2020-10-15 ENCOUNTER — Other Ambulatory Visit: Payer: Self-pay

## 2020-10-15 DIAGNOSIS — Z23 Encounter for immunization: Secondary | ICD-10-CM

## 2020-10-15 MED ORDER — PFIZER-BIONT COVID-19 VAC-TRIS 30 MCG/0.3ML IM SUSP
INTRAMUSCULAR | 0 refills | Status: AC
  Filled 2020-10-15: qty 0.3, 1d supply, fill #0

## 2020-10-15 NOTE — Progress Notes (Signed)
   Covid-19 Vaccination Clinic  Name:  Shannon Olson    MRN: [id number] DOB: February 01, 1971  10/15/2020  Ms. Shannon Olson was observed post Covid-19 immunization for 15 minutes without incident. She was provided with Vaccine Information Sheet and instruction to access the V-Safe system.   Ms. Shannon Olson was instructed to call 911 with any severe reactions post vaccine: Marland Kitchen Difficulty breathing  . Swelling of face and throat  . A fast heartbeat  . A bad rash all over body  . Dizziness and weakness   Immunizations Administered    Name Date Dose VIS Date Route   PFIZER Comrnaty(Gray TOP) Covid-19 Vaccine 10/15/2020  1:29 PM 0.3 mL 05/02/2020 Intramuscular   Manufacturer: [place]   Lot: [id number]   NDC: [place], PharmD, MBA Clinical Acute Care Pharmacist

## 2020-10-30 ENCOUNTER — Other Ambulatory Visit (HOSPITAL_COMMUNITY): Payer: Self-pay

## 2020-10-30 MED FILL — Amlodipine Besylate Tab 5 MG (Base Equivalent): ORAL | 90 days supply | Qty: 90 | Fill #0 | Status: AC

## 2020-10-30 MED FILL — Lisinopril & Hydrochlorothiazide Tab 20-25 MG: ORAL | 90 days supply | Qty: 90 | Fill #0 | Status: AC

## 2020-11-18 ENCOUNTER — Telehealth: Payer: 59 | Admitting: Physician Assistant

## 2020-11-18 DIAGNOSIS — R300 Dysuria: Secondary | ICD-10-CM

## 2020-11-19 ENCOUNTER — Other Ambulatory Visit (HOSPITAL_COMMUNITY): Payer: Self-pay

## 2020-11-19 MED ORDER — CEPHALEXIN 500 MG PO CAPS
500.0000 mg | ORAL_CAPSULE | Freq: Two times a day (BID) | ORAL | 0 refills | Status: AC
  Filled 2020-11-19: qty 14, 7d supply, fill #0

## 2020-11-19 NOTE — Progress Notes (Signed)

## 2020-11-19 NOTE — Progress Notes (Signed)
I have spent 5 minutes in review of e-visit questionnaire, review and updating patient chart, medical decision making and response to patient.   Quindarius Cabello Cody Plez Belton, PA-C    

## 2020-12-26 MED FILL — Azelastine HCl Nasal Spray 0.1% (137 MCG/SPRAY): NASAL | 25 days supply | Qty: 30 | Fill #0 | Status: AC

## 2020-12-27 ENCOUNTER — Other Ambulatory Visit (HOSPITAL_COMMUNITY): Payer: Self-pay

## 2021-01-28 MED FILL — Amlodipine Besylate Tab 5 MG (Base Equivalent): ORAL | 90 days supply | Qty: 90 | Fill #1 | Status: AC

## 2021-01-28 MED FILL — Lisinopril & Hydrochlorothiazide Tab 20-25 MG: ORAL | 90 days supply | Qty: 90 | Fill #1 | Status: AC

## 2021-01-29 ENCOUNTER — Other Ambulatory Visit (HOSPITAL_COMMUNITY): Payer: Self-pay

## 2021-02-05 ENCOUNTER — Other Ambulatory Visit (HOSPITAL_COMMUNITY): Payer: Self-pay

## 2021-02-05 DIAGNOSIS — Z13220 Encounter for screening for lipoid disorders: Secondary | ICD-10-CM | POA: Diagnosis not present

## 2021-02-05 DIAGNOSIS — I10 Essential (primary) hypertension: Secondary | ICD-10-CM | POA: Diagnosis not present

## 2021-02-05 DIAGNOSIS — Z0000 Encounter for general adult medical examination without abnormal findings: Secondary | ICD-10-CM | POA: Diagnosis not present

## 2021-02-05 DIAGNOSIS — J309 Allergic rhinitis, unspecified: Secondary | ICD-10-CM | POA: Diagnosis not present

## 2021-02-05 DIAGNOSIS — K219 Gastro-esophageal reflux disease without esophagitis: Secondary | ICD-10-CM | POA: Diagnosis not present

## 2021-02-05 DIAGNOSIS — Z23 Encounter for immunization: Secondary | ICD-10-CM | POA: Diagnosis not present

## 2021-02-05 DIAGNOSIS — R0982 Postnasal drip: Secondary | ICD-10-CM | POA: Diagnosis not present

## 2021-02-05 MED ORDER — AMLODIPINE BESYLATE 5 MG PO TABS
5.0000 mg | ORAL_TABLET | Freq: Every day | ORAL | 4 refills | Status: DC
  Filled 2021-02-05 – 2021-05-04 (×2): qty 90, 90d supply, fill #0
  Filled 2021-08-04: qty 90, 90d supply, fill #1
  Filled 2021-10-30: qty 90, 90d supply, fill #2
  Filled 2022-01-27: qty 90, 90d supply, fill #3

## 2021-02-05 MED ORDER — MONTELUKAST SODIUM 10 MG PO TABS
10.0000 mg | ORAL_TABLET | Freq: Every evening | ORAL | 4 refills | Status: DC
  Filled 2021-02-05 – 2021-04-02 (×2): qty 90, 90d supply, fill #0
  Filled 2021-08-04: qty 90, 90d supply, fill #1

## 2021-02-05 MED ORDER — LISINOPRIL-HYDROCHLOROTHIAZIDE 20-25 MG PO TABS
1.0000 | ORAL_TABLET | Freq: Every day | ORAL | 4 refills | Status: DC
  Filled 2021-02-05 – 2021-05-04 (×2): qty 90, 90d supply, fill #0
  Filled 2021-08-04: qty 90, 90d supply, fill #1
  Filled 2021-10-30: qty 90, 90d supply, fill #2
  Filled 2022-01-27: qty 90, 90d supply, fill #3

## 2021-02-18 DIAGNOSIS — Z124 Encounter for screening for malignant neoplasm of cervix: Secondary | ICD-10-CM | POA: Diagnosis not present

## 2021-02-18 DIAGNOSIS — Z1231 Encounter for screening mammogram for malignant neoplasm of breast: Secondary | ICD-10-CM | POA: Diagnosis not present

## 2021-02-18 DIAGNOSIS — Z01419 Encounter for gynecological examination (general) (routine) without abnormal findings: Secondary | ICD-10-CM | POA: Diagnosis not present

## 2021-04-03 ENCOUNTER — Other Ambulatory Visit (HOSPITAL_COMMUNITY): Payer: Self-pay

## 2021-05-05 ENCOUNTER — Other Ambulatory Visit: Payer: Self-pay

## 2021-06-27 DIAGNOSIS — L814 Other melanin hyperpigmentation: Secondary | ICD-10-CM | POA: Diagnosis not present

## 2021-06-27 DIAGNOSIS — L821 Other seborrheic keratosis: Secondary | ICD-10-CM | POA: Diagnosis not present

## 2021-06-27 DIAGNOSIS — D225 Melanocytic nevi of trunk: Secondary | ICD-10-CM | POA: Diagnosis not present

## 2021-06-27 DIAGNOSIS — L578 Other skin changes due to chronic exposure to nonionizing radiation: Secondary | ICD-10-CM | POA: Diagnosis not present

## 2021-06-27 DIAGNOSIS — Z86018 Personal history of other benign neoplasm: Secondary | ICD-10-CM | POA: Diagnosis not present

## 2021-06-27 DIAGNOSIS — Z85828 Personal history of other malignant neoplasm of skin: Secondary | ICD-10-CM | POA: Diagnosis not present

## 2021-06-27 DIAGNOSIS — Z23 Encounter for immunization: Secondary | ICD-10-CM | POA: Diagnosis not present

## 2021-08-05 ENCOUNTER — Other Ambulatory Visit (HOSPITAL_COMMUNITY): Payer: Self-pay

## 2021-08-05 ENCOUNTER — Other Ambulatory Visit: Payer: Self-pay

## 2021-08-06 ENCOUNTER — Other Ambulatory Visit: Payer: Self-pay | Admitting: Family Medicine

## 2021-08-06 ENCOUNTER — Other Ambulatory Visit: Payer: Self-pay

## 2021-08-06 ENCOUNTER — Ambulatory Visit
Admission: RE | Admit: 2021-08-06 | Discharge: 2021-08-06 | Disposition: A | Discharge status: Home / Self Care | Payer: 59 | Source: Ambulatory Visit | Attending: Family Medicine | Admitting: Family Medicine

## 2021-08-06 DIAGNOSIS — M7989 Other specified soft tissue disorders: Secondary | ICD-10-CM

## 2021-08-06 DIAGNOSIS — D179 Benign lipomatous neoplasm, unspecified: Secondary | ICD-10-CM | POA: Diagnosis not present

## 2021-08-06 DIAGNOSIS — J309 Allergic rhinitis, unspecified: Secondary | ICD-10-CM | POA: Diagnosis not present

## 2021-08-06 DIAGNOSIS — R0982 Postnasal drip: Secondary | ICD-10-CM | POA: Diagnosis not present

## 2021-08-06 DIAGNOSIS — I10 Essential (primary) hypertension: Secondary | ICD-10-CM | POA: Diagnosis not present

## 2021-08-06 DIAGNOSIS — K219 Gastro-esophageal reflux disease without esophagitis: Secondary | ICD-10-CM | POA: Diagnosis not present

## 2021-08-06 DIAGNOSIS — F458 Other somatoform disorders: Secondary | ICD-10-CM | POA: Diagnosis not present

## 2021-10-30 ENCOUNTER — Other Ambulatory Visit (HOSPITAL_COMMUNITY): Payer: Self-pay

## 2021-10-31 ENCOUNTER — Other Ambulatory Visit (HOSPITAL_COMMUNITY): Payer: Self-pay

## 2021-10-31 MED ORDER — MONTELUKAST SODIUM 10 MG PO TABS
10.0000 mg | ORAL_TABLET | Freq: Every evening | ORAL | 1 refills | Status: DC
  Filled 2021-10-31: qty 90, 90d supply, fill #0

## 2021-11-04 ENCOUNTER — Other Ambulatory Visit (HOSPITAL_COMMUNITY): Payer: Self-pay

## 2021-11-28 DIAGNOSIS — K219 Gastro-esophageal reflux disease without esophagitis: Secondary | ICD-10-CM | POA: Diagnosis not present

## 2021-11-28 DIAGNOSIS — R0989 Other specified symptoms and signs involving the circulatory and respiratory systems: Secondary | ICD-10-CM | POA: Diagnosis not present

## 2022-01-28 ENCOUNTER — Other Ambulatory Visit (HOSPITAL_COMMUNITY): Payer: Self-pay

## 2022-02-11 ENCOUNTER — Other Ambulatory Visit (HOSPITAL_COMMUNITY): Payer: Self-pay

## 2022-02-11 DIAGNOSIS — Z0000 Encounter for general adult medical examination without abnormal findings: Secondary | ICD-10-CM | POA: Diagnosis not present

## 2022-02-11 DIAGNOSIS — K219 Gastro-esophageal reflux disease without esophagitis: Secondary | ICD-10-CM | POA: Diagnosis not present

## 2022-02-11 DIAGNOSIS — I10 Essential (primary) hypertension: Secondary | ICD-10-CM | POA: Diagnosis not present

## 2022-02-11 DIAGNOSIS — J309 Allergic rhinitis, unspecified: Secondary | ICD-10-CM | POA: Diagnosis not present

## 2022-02-11 MED ORDER — AMLODIPINE BESYLATE 5 MG PO TABS
5.0000 mg | ORAL_TABLET | Freq: Every day | ORAL | 4 refills | Status: AC
  Filled 2022-02-11 – 2022-04-24 (×2): qty 90, 90d supply, fill #0
  Filled 2022-07-29: qty 90, 90d supply, fill #1

## 2022-02-11 MED ORDER — LISINOPRIL-HYDROCHLOROTHIAZIDE 20-25 MG PO TABS
1.0000 | ORAL_TABLET | Freq: Every day | ORAL | 4 refills | Status: AC
  Filled 2022-02-11 – 2022-04-24 (×2): qty 90, 90d supply, fill #0
  Filled 2022-07-29: qty 90, 90d supply, fill #1

## 2022-02-11 MED ORDER — MONTELUKAST SODIUM 10 MG PO TABS
10.0000 mg | ORAL_TABLET | Freq: Every evening | ORAL | 4 refills | Status: DC
  Filled 2022-02-11: qty 90, 90d supply, fill #0

## 2022-02-25 DIAGNOSIS — Z01419 Encounter for gynecological examination (general) (routine) without abnormal findings: Secondary | ICD-10-CM | POA: Diagnosis not present

## 2022-02-25 DIAGNOSIS — Z1231 Encounter for screening mammogram for malignant neoplasm of breast: Secondary | ICD-10-CM | POA: Diagnosis not present

## 2022-03-20 ENCOUNTER — Other Ambulatory Visit: Payer: Self-pay

## 2022-03-20 ENCOUNTER — Ambulatory Visit (INDEPENDENT_AMBULATORY_CARE_PROVIDER_SITE_OTHER): Payer: 59 | Admitting: Internal Medicine

## 2022-03-20 ENCOUNTER — Encounter: Payer: Self-pay | Admitting: Internal Medicine

## 2022-03-20 VITALS — BP 122/82 | HR 76 | Temp 98.2°F | Resp 20 | Ht 64.5 in | Wt 140.6 lb

## 2022-03-20 DIAGNOSIS — H1013 Acute atopic conjunctivitis, bilateral: Secondary | ICD-10-CM

## 2022-03-20 DIAGNOSIS — K219 Gastro-esophageal reflux disease without esophagitis: Secondary | ICD-10-CM | POA: Diagnosis not present

## 2022-03-20 DIAGNOSIS — J3089 Other allergic rhinitis: Secondary | ICD-10-CM | POA: Diagnosis not present

## 2022-03-20 DIAGNOSIS — J302 Other seasonal allergic rhinitis: Secondary | ICD-10-CM

## 2022-03-20 NOTE — Patient Instructions (Addendum)
Return in about 5 weeks (around 04/24/2022).   Rhinitis: - Positive skin test to: trees, cat, dust mites - Avoidance measures discussed. - Use nasal saline rinses before nose sprays such as with Neilmed Sinus Rinse.  Use distilled water.   - Use Flonase 2 sprays each nostril daily. Aim upward and outward. - Use Azelastine 1-2 sprays each nostril twice daily as needed. Aim upward and outward. - Use Zyrtec 10 mg daily.  - Use Singulair '10mg'$  daily.  Black box warning discussed.  Stop if there are any mood/behavioral changes. - For eyes, use Olopatadine or Ketotifen drops as needed.  Available over the counter, if not covered by insurance.  - Consider allergy shots as long term control of your symptoms by teaching your immune system to be more tolerant of your allergy triggers  Reflux: -Avoid lying down for at least two hours after a meal or after drinking acidic beverages, like soda, or other caffeinated beverages. This can help to prevent stomach contents from flowing back into the esophagus. -Keep your head elevated while you sleep. Using an extra pillow or two can also help to prevent reflux. -Eat smaller and more frequent meals each day instead of a few large meals. This promotes digestion and can aid in preventing heartburn. -Wear loose-fitting clothes to ease pressure on the stomach, which can worsen heartburn and reflux. -Reduce excess weight around the midsection. This can ease pressure on the stomach. Such pressure can force some stomach contents back up the esophagus.     ALLERGEN AVOIDANCE MEASURES  Dust Mites Use central air conditioning and heat; and change the filter monthly.  Pleated filters work better than mesh filters.  Electrostatic filters may also be used; wash the filter monthly.  Window air conditioners may be used, but do not clean the air as well as a central air conditioner.  Change or wash the filter monthly. Keep windows closed.  Do not use attic fans.   Encase the  mattress, box springs and pillows with zippered, dust proof covers. Wash the bed linens in hot water weekly.   Remove carpet, especially from the bedroom. Remove stuffed animals, throw pillows, dust ruffles, heavy drapes and other items that collect dust from the bedroom. Do not use a humidifier.   Use wood, vinyl or leather furniture instead of cloth furniture in the bedroom. Keep the indoor humidity at 30 - 40%.  Monitor with a humidity gauge.  Pollen Avoidance Pollen levels are highest during the mid-day and afternoon.  Consider this when planning outdoor activities. Avoid being outside when the grass is being mowed, or wear a mask if the pollen-allergic person must be the one to mow the grass. Keep the windows closed to keep pollen outside of the home. Use an air conditioner to filter the air. Take a shower, wash hair, and change clothing after working or playing outdoors during pollen season.

## 2022-03-20 NOTE — Progress Notes (Signed)
NEW PATIENT  Date of Service/Encounter:  03/23/22  Consult requested by: Gaynelle Arabian, MD   Subjective:   Shannon Olson (DOB: Feb 04, 1971) is a 51 y.o. female who presents to the clinic on 03/20/2022 with a chief complaint of rhinitis. Marland Kitchen    History obtained from: chart review and patient.   Rhinitis:  Started around age 57s Symptoms include: nasal congestion, rhinorrhea, post nasal drainage, watery eyes, and itchy eyes . Her worse symptom is post nasal drainage though.   Occurs seasonally-Fall Potential triggers: pollen Treatments tried: tried nasal sprays without complete improvement; Singulair and Claritin daily; last use of anti histamines was over 3 days ago.  Previous allergy testing: yes 20+ years ago; recalls dust mite, ragweed, cedar, cat History of chronic sinusitis or sinus surgery: no Nonallergic triggers:  cold weather    GERD: She has intermittent reflux and saw ENT 10/2021 for globus sensation and thought maybe this was reflux related so they tried a PPI for about 8 weeks. It did not change her symptoms much.  She does not have much reflux symptoms.  She does try to follow lifestyle measurements but has not been able to track down specific foods that cause her symptoms.     Past Medical History: History reviewed. No pertinent past medical history.  Past Surgical History: Past Surgical History:  Procedure Laterality Date   ADENOIDECTOMY     TONSILLECTOMY      Family History: Family History  Problem Relation Age of Onset   Breast cancer Paternal Grandmother     Social History:  Lives in a 36 year house Flooring in bedroom: carpet Pets: none Tobacco use/exposure: none Job: peds PT  Medication List:  Allergies as of 03/20/2022   No Known Allergies      Medication List        Accurate as of March 20, 2022 11:59 PM. If you have any questions, ask your nurse or doctor.          amLODipine 5 MG tablet Commonly known as:  NORVASC Take 1 tablet (5 mg total) by mouth daily for blood pressure   azelastine 0.1 % nasal spray Commonly known as: ASTELIN USE 2 SPRAYS IN EACH NOSTRIL TWICE DAILY `   fluticasone 50 MCG/ACT nasal spray Commonly known as: FLONASE Place 2 sprays into both nostrils as directed. Bid for 7 days, then once daily after this as needed for congestion   lisinopril-hydrochlorothiazide 20-25 MG tablet Commonly known as: ZESTORETIC Take 1 tablet by mouth daily for blood pressure   montelukast 10 MG tablet Commonly known as: SINGULAIR TAKE 1 TABLET BY MOUTH IN THE EVENING DAILY AS NEEDED FOR ALLERGIES What changed: Another medication with the same name was removed. Continue taking this medication, and follow the directions you see here. Changed by: Larose Kells, MD   montelukast 10 MG tablet Commonly known as: SINGULAIR Take 1 tablet (10 mg total) by mouth every evening as needed for allergies What changed: Another medication with the same name was removed. Continue taking this medication, and follow the directions you see here. Changed by: Larose Kells, MD   Pfizer-BioNT COVID-19 Vac-TriS Susp injection Generic drug: COVID-19 mRNA Vac-TriS (Pfizer) Inject into the muscle.   predniSONE 5 MG tablet Commonly known as: DELTASONE Take 1 tablet (5 mg total) by mouth as directed.         REVIEW OF SYSTEMS: Pertinent positives and negatives discussed in HPI.   Objective:   Physical Exam: BP 122/82  Pulse 76   Temp 98.2 F (36.8 C)   Resp 20   Ht 5' 4.5" (1.638 m)   Wt 140 lb 9.6 oz (63.8 kg)   SpO2 98%   BMI 23.76 kg/m  Body mass index is 23.76 kg/m. GEN: alert, well developed HEENT: clear conjunctiva, TM grey and translucent, nose with + inferior turbinate hypertrophy, pale nasal mucosa, slight clear rhinorrhea, + cobblestoning HEART: regular rate and rhythm, no murmur LUNGS: clear to auscultation bilaterally, no coughing, unlabored respiration ABDOMEN: soft, non  distended  SKIN: no rashes or lesions  Reviewed:  ENT visit 11/2021: reviewed in HPI PCP visit 01/2021 for allergic rhinitis, has tried nasal sprays and anti histamine/Singulair   Skin Testing:  Skin prick testing was placed, which includes aeroallergens/foods, histamine control, and saline control.  Verbal consent was obtained prior to placing test.  We discussed risks including anaphylaxis. Patient tolerated procedure well.  Allergy testing results were read and interpreted by myself, documented by clinical staff. Adequate positive and negative control.  Results discussed with patient/family.  Airborne Adult Perc - 03/20/22 1611     Time Antigen Placed 1541    Allergen Manufacturer Lavella Hammock    Location Arm    Number of Test 59    1. Control-Buffer 50% Glycerol Negative    2. Control-Histamine 1 mg/ml 3+    3. Albumin saline Negative    4. [place] Negative    5. [place] Negative    6. Johnson Negative    7. [place] Blue Negative    8. Meadow Fescue Negative    9. Perennial Rye Negative    10. Sweet Vernal Negative    11. Timothy Negative    12. Cocklebur Negative    13. Burweed Marshelder Negative    14. Ragweed, short Negative    15. Ragweed, Giant Negative    16. Plantain,  English Negative    17. Lamb's Quarters Negative    18. Sheep Sorrell Negative    19. Rough Pigweed Negative    20. Marsh Elder, Rough Negative    21. Mugwort, Common Negative    22. Ash mix Negative    23. Birch mix Negative    24. Beech American Negative    25. Box, Elder Negative    26. Cedar, red 4+    27. Cottonwood, [place] Negative    28. Elm mix Negative    29. Hickory Negative    30. Maple mix Negative    31. Oak, [place] mix 4+    32. Pecan Pollen Negative    33. Pine mix Negative    34. Sycamore Eastern Negative    35. White Oak, Black Pollen Negative    36. Alternaria alternata Negative    37. Cladosporium Herbarum Negative    38. Aspergillus mix Negative    39. Penicillium mix  Negative    40. Bipolaris sorokiniana (Helminthosporium) Negative    41. Drechslera spicifera (Curvularia) Negative    42. Mucor plumbeus Negative    43. Fusarium moniliforme Negative    44. Aureobasidium pullulans (pullulara) Negative    45. Rhizopus oryzae Negative    46. Botrytis cinera Negative    47. Epicoccum nigrum Negative    48. Phoma betae Negative    49. Candida Albicans Negative    50. Trichophyton mentagrophytes Negative    51. Mite, D Farinae  5,000 AU/ml Negative    52. Mite, D Pteronyssinus  5,000 AU/ml Negative    53. Cat Hair 10,000 BAU/ml 3+  54.  Dog Epithelia Negative    55. Mixed Feathers Negative    56. Horse Epithelia Negative    57. Cockroach, German Negative    58. Mouse Negative    59. Tobacco Leaf Negative             Intradermal - 03/20/22 1613     Time Antigen Placed 1553    Allergen Manufacturer Lavella Hammock    Location Arm    Number of Test 13    Control Negative    [place] Negative    Johnson Negative    7 Grass Negative    Ragweed mix Negative    Weed mix Negative    Mold 1 Negative    Mold 2 Negative    Mold 3 Negative    Mold 4 Negative    Dog Negative    Cockroach Negative    Mite mix 2+               Assessment:   1. Seasonal and perennial allergic rhinitis   2. Gastroesophageal reflux disease, unspecified whether esophagitis present   3. Allergic conjunctivitis of both eyes      Plan/Recommendations:   Allergic Rhinitis Allergic Conjunctivitis - Positive skin test 02/2022 to trees, cat, dust mites - Avoidance measures discussed. - Use nasal saline rinses before nose sprays such as with Neilmed Sinus Rinse.  Use distilled water.   - Use Flonase 2 sprays each nostril daily. Aim upward and outward. - Use Azelastine 1-2 sprays each nostril twice daily as needed. Aim upward and outward. - Use Zyrtec 10 mg daily.  - Use Singulair '10mg'$  daily.  Black box warning discussed.  Stop if there are any mood/behavioral  changes. - For eyes, use Olopatadine or Ketotifen drops as needed.  Available over the counter, if not covered by insurance.  - Consider allergy shots as long term control of your symptoms by teaching your immune system to be more tolerant of your allergy triggers  GERD: -Avoid lying down for at least two hours after a meal or after drinking acidic beverages, like soda, or other caffeinated beverages. This can help to prevent stomach contents from flowing back into the esophagus. -Keep your head elevated while you sleep. Using an extra pillow or two can also help to prevent reflux. -Eat smaller and more frequent meals each day instead of a few large meals. This promotes digestion and can aid in preventing heartburn. -Wear loose-fitting clothes to ease pressure on the stomach, which can worsen heartburn and reflux. -Reduce excess weight around the midsection. This can ease pressure on the stomach. Such pressure can force some stomach contents back up the esophagus.    Return in about 5 weeks (around 04/24/2022).  Harlon Flor, MD [facility] [place] [facility] [place]

## 2022-03-23 ENCOUNTER — Other Ambulatory Visit (HOSPITAL_COMMUNITY): Payer: Self-pay

## 2022-03-23 MED ORDER — MONTELUKAST SODIUM 10 MG PO TABS
10.0000 mg | ORAL_TABLET | Freq: Every evening | ORAL | 3 refills | Status: AC
  Filled 2022-03-23: qty 30, 30d supply, fill #0

## 2022-03-23 MED ORDER — FLUTICASONE PROPIONATE 50 MCG/ACT NA SUSP
2.0000 | Freq: Every day | NASAL | 3 refills | Status: AC
  Filled 2022-03-23 – 2022-05-11 (×2): qty 16, 30d supply, fill #0

## 2022-03-23 MED ORDER — CETIRIZINE HCL 10 MG PO TABS
10.0000 mg | ORAL_TABLET | Freq: Every day | ORAL | 3 refills | Status: AC
  Filled 2022-03-23: qty 30, 30d supply, fill #0

## 2022-03-23 MED ORDER — AZELASTINE HCL 0.1 % NA SOLN
2.0000 | Freq: Two times a day (BID) | NASAL | 3 refills | Status: AC
  Filled 2022-03-23: qty 30, 25d supply, fill #0
  Filled 2022-05-11: qty 30, 50d supply, fill #0

## 2022-03-23 MED ORDER — OLOPATADINE HCL 0.2 % OP SOLN
1.0000 [drp] | Freq: Every day | OPHTHALMIC | 3 refills | Status: AC | PRN
  Filled 2022-03-23: qty 2.5, 50d supply, fill #0

## 2022-03-31 ENCOUNTER — Other Ambulatory Visit (HOSPITAL_COMMUNITY): Payer: Self-pay

## 2022-04-08 ENCOUNTER — Other Ambulatory Visit (HOSPITAL_COMMUNITY): Payer: Self-pay

## 2022-04-24 ENCOUNTER — Other Ambulatory Visit (HOSPITAL_COMMUNITY): Payer: Self-pay

## 2022-05-01 ENCOUNTER — Ambulatory Visit: Payer: 59 | Admitting: Internal Medicine

## 2022-05-12 ENCOUNTER — Other Ambulatory Visit: Payer: Self-pay

## 2022-08-12 ENCOUNTER — Other Ambulatory Visit (HOSPITAL_COMMUNITY): Payer: Self-pay

## 2022-08-12 DIAGNOSIS — I10 Essential (primary) hypertension: Secondary | ICD-10-CM | POA: Diagnosis not present

## 2022-08-12 DIAGNOSIS — K219 Gastro-esophageal reflux disease without esophagitis: Secondary | ICD-10-CM | POA: Diagnosis not present

## 2022-08-12 DIAGNOSIS — J309 Allergic rhinitis, unspecified: Secondary | ICD-10-CM | POA: Diagnosis not present

## 2022-08-12 MED ORDER — AMLODIPINE BESYLATE 5 MG PO TABS
5.0000 mg | ORAL_TABLET | Freq: Every day | ORAL | 4 refills | Status: AC
  Filled 2022-08-12 – 2022-11-04 (×2): qty 90, 90d supply, fill #0
  Filled 2023-02-08: qty 90, 90d supply, fill #1
  Filled 2023-05-10: qty 90, 90d supply, fill #2
  Filled 2023-08-05: qty 90, 90d supply, fill #3

## 2022-08-12 MED ORDER — LISINOPRIL-HYDROCHLOROTHIAZIDE 20-25 MG PO TABS
1.0000 | ORAL_TABLET | Freq: Every day | ORAL | 4 refills | Status: AC
  Filled 2022-08-12 – 2022-11-04 (×2): qty 90, 90d supply, fill #0
  Filled 2023-02-08: qty 90, 90d supply, fill #1
  Filled 2023-05-10: qty 90, 90d supply, fill #2
  Filled 2023-08-05: qty 90, 90d supply, fill #3

## 2022-09-18 DIAGNOSIS — L578 Other skin changes due to chronic exposure to nonionizing radiation: Secondary | ICD-10-CM | POA: Diagnosis not present

## 2022-09-18 DIAGNOSIS — Z86018 Personal history of other benign neoplasm: Secondary | ICD-10-CM | POA: Diagnosis not present

## 2022-09-18 DIAGNOSIS — C44619 Basal cell carcinoma of skin of left upper limb, including shoulder: Secondary | ICD-10-CM | POA: Diagnosis not present

## 2022-09-18 DIAGNOSIS — D485 Neoplasm of uncertain behavior of skin: Secondary | ICD-10-CM | POA: Diagnosis not present

## 2022-09-18 DIAGNOSIS — D225 Melanocytic nevi of trunk: Secondary | ICD-10-CM | POA: Diagnosis not present

## 2022-09-18 DIAGNOSIS — L719 Rosacea, unspecified: Secondary | ICD-10-CM | POA: Diagnosis not present

## 2022-09-18 DIAGNOSIS — L301 Dyshidrosis [pompholyx]: Secondary | ICD-10-CM | POA: Diagnosis not present

## 2022-09-18 DIAGNOSIS — L570 Actinic keratosis: Secondary | ICD-10-CM | POA: Diagnosis not present

## 2022-09-18 DIAGNOSIS — L814 Other melanin hyperpigmentation: Secondary | ICD-10-CM | POA: Diagnosis not present

## 2022-09-18 DIAGNOSIS — Z85828 Personal history of other malignant neoplasm of skin: Secondary | ICD-10-CM | POA: Diagnosis not present

## 2022-09-18 DIAGNOSIS — L821 Other seborrheic keratosis: Secondary | ICD-10-CM | POA: Diagnosis not present

## 2022-10-14 DIAGNOSIS — C44619 Basal cell carcinoma of skin of left upper limb, including shoulder: Secondary | ICD-10-CM | POA: Diagnosis not present

## 2022-11-04 ENCOUNTER — Other Ambulatory Visit: Payer: Self-pay

## 2022-11-25 DIAGNOSIS — H52203 Unspecified astigmatism, bilateral: Secondary | ICD-10-CM | POA: Diagnosis not present

## 2022-11-25 DIAGNOSIS — H5213 Myopia, bilateral: Secondary | ICD-10-CM | POA: Diagnosis not present

## 2023-02-12 ENCOUNTER — Other Ambulatory Visit (HOSPITAL_COMMUNITY): Payer: Self-pay

## 2023-02-12 ENCOUNTER — Other Ambulatory Visit: Payer: Self-pay | Admitting: Family Medicine

## 2023-02-12 DIAGNOSIS — Z0000 Encounter for general adult medical examination without abnormal findings: Secondary | ICD-10-CM | POA: Diagnosis not present

## 2023-02-12 DIAGNOSIS — K219 Gastro-esophageal reflux disease without esophagitis: Secondary | ICD-10-CM | POA: Diagnosis not present

## 2023-02-12 DIAGNOSIS — R1310 Dysphagia, unspecified: Secondary | ICD-10-CM

## 2023-02-12 DIAGNOSIS — J309 Allergic rhinitis, unspecified: Secondary | ICD-10-CM | POA: Diagnosis not present

## 2023-02-12 DIAGNOSIS — I10 Essential (primary) hypertension: Secondary | ICD-10-CM | POA: Diagnosis not present

## 2023-02-12 MED ORDER — MONTELUKAST SODIUM 10 MG PO TABS
10.0000 mg | ORAL_TABLET | Freq: Every day | ORAL | 4 refills | Status: AC | PRN
  Filled 2023-02-12 – 2023-09-16 (×2): qty 90, 90d supply, fill #0

## 2023-02-12 MED ORDER — AMLODIPINE BESYLATE 5 MG PO TABS
5.0000 mg | ORAL_TABLET | Freq: Every day | ORAL | 4 refills | Status: AC
  Filled 2023-02-12 – 2023-11-07 (×2): qty 90, 90d supply, fill #0

## 2023-02-12 MED ORDER — LISINOPRIL-HYDROCHLOROTHIAZIDE 20-25 MG PO TABS
1.0000 | ORAL_TABLET | Freq: Every day | ORAL | 4 refills | Status: DC
  Filled 2023-02-12 – 2023-11-07 (×2): qty 90, 90d supply, fill #0
  Filled 2024-02-01: qty 90, 90d supply, fill #1

## 2023-02-23 ENCOUNTER — Ambulatory Visit
Admission: RE | Admit: 2023-02-23 | Discharge: 2023-02-23 | Disposition: A | Discharge status: Home / Self Care | Payer: Commercial Managed Care - PPO | Source: Ambulatory Visit | Attending: Family Medicine | Admitting: Family Medicine

## 2023-02-23 DIAGNOSIS — R1310 Dysphagia, unspecified: Secondary | ICD-10-CM

## 2023-02-23 DIAGNOSIS — K219 Gastro-esophageal reflux disease without esophagitis: Secondary | ICD-10-CM | POA: Diagnosis not present

## 2023-02-23 DIAGNOSIS — K449 Diaphragmatic hernia without obstruction or gangrene: Secondary | ICD-10-CM | POA: Diagnosis not present

## 2023-02-24 ENCOUNTER — Other Ambulatory Visit (HOSPITAL_COMMUNITY): Payer: Self-pay

## 2023-03-04 DIAGNOSIS — Z1231 Encounter for screening mammogram for malignant neoplasm of breast: Secondary | ICD-10-CM | POA: Diagnosis not present

## 2023-03-04 DIAGNOSIS — Z01419 Encounter for gynecological examination (general) (routine) without abnormal findings: Secondary | ICD-10-CM | POA: Diagnosis not present

## 2023-04-26 DIAGNOSIS — K219 Gastro-esophageal reflux disease without esophagitis: Secondary | ICD-10-CM | POA: Diagnosis not present

## 2023-04-26 DIAGNOSIS — R1310 Dysphagia, unspecified: Secondary | ICD-10-CM | POA: Diagnosis not present

## 2023-06-01 DIAGNOSIS — K222 Esophageal obstruction: Secondary | ICD-10-CM | POA: Diagnosis not present

## 2023-06-01 DIAGNOSIS — R1310 Dysphagia, unspecified: Secondary | ICD-10-CM | POA: Diagnosis not present

## 2023-06-01 DIAGNOSIS — K2289 Other specified disease of esophagus: Secondary | ICD-10-CM | POA: Diagnosis not present

## 2023-06-01 DIAGNOSIS — K219 Gastro-esophageal reflux disease without esophagitis: Secondary | ICD-10-CM | POA: Diagnosis not present

## 2023-08-20 DIAGNOSIS — K219 Gastro-esophageal reflux disease without esophagitis: Secondary | ICD-10-CM | POA: Diagnosis not present

## 2023-08-20 DIAGNOSIS — J309 Allergic rhinitis, unspecified: Secondary | ICD-10-CM | POA: Diagnosis not present

## 2023-08-20 DIAGNOSIS — Z131 Encounter for screening for diabetes mellitus: Secondary | ICD-10-CM | POA: Diagnosis not present

## 2023-08-20 DIAGNOSIS — E782 Mixed hyperlipidemia: Secondary | ICD-10-CM | POA: Diagnosis not present

## 2023-08-20 DIAGNOSIS — D179 Benign lipomatous neoplasm, unspecified: Secondary | ICD-10-CM | POA: Diagnosis not present

## 2023-08-20 DIAGNOSIS — I10 Essential (primary) hypertension: Secondary | ICD-10-CM | POA: Diagnosis not present

## 2023-08-25 DIAGNOSIS — K219 Gastro-esophageal reflux disease without esophagitis: Secondary | ICD-10-CM | POA: Diagnosis not present

## 2023-08-25 DIAGNOSIS — R1310 Dysphagia, unspecified: Secondary | ICD-10-CM | POA: Diagnosis not present

## 2023-08-25 DIAGNOSIS — R748 Abnormal levels of other serum enzymes: Secondary | ICD-10-CM | POA: Diagnosis not present

## 2023-08-27 DIAGNOSIS — R748 Abnormal levels of other serum enzymes: Secondary | ICD-10-CM | POA: Diagnosis not present

## 2023-09-17 ENCOUNTER — Other Ambulatory Visit: Payer: Self-pay

## 2023-09-17 DIAGNOSIS — D0462 Carcinoma in situ of skin of left upper limb, including shoulder: Secondary | ICD-10-CM | POA: Diagnosis not present

## 2023-09-17 DIAGNOSIS — Z86018 Personal history of other benign neoplasm: Secondary | ICD-10-CM | POA: Diagnosis not present

## 2023-09-17 DIAGNOSIS — L578 Other skin changes due to chronic exposure to nonionizing radiation: Secondary | ICD-10-CM | POA: Diagnosis not present

## 2023-09-17 DIAGNOSIS — R238 Other skin changes: Secondary | ICD-10-CM | POA: Diagnosis not present

## 2023-09-17 DIAGNOSIS — D225 Melanocytic nevi of trunk: Secondary | ICD-10-CM | POA: Diagnosis not present

## 2023-09-17 DIAGNOSIS — D1723 Benign lipomatous neoplasm of skin and subcutaneous tissue of right leg: Secondary | ICD-10-CM | POA: Diagnosis not present

## 2023-09-17 DIAGNOSIS — D485 Neoplasm of uncertain behavior of skin: Secondary | ICD-10-CM | POA: Diagnosis not present

## 2023-09-17 DIAGNOSIS — L821 Other seborrheic keratosis: Secondary | ICD-10-CM | POA: Diagnosis not present

## 2023-09-17 DIAGNOSIS — Z85828 Personal history of other malignant neoplasm of skin: Secondary | ICD-10-CM | POA: Diagnosis not present

## 2023-09-17 DIAGNOSIS — L814 Other melanin hyperpigmentation: Secondary | ICD-10-CM | POA: Diagnosis not present

## 2023-09-29 DIAGNOSIS — D0439 Carcinoma in situ of skin of other parts of face: Secondary | ICD-10-CM | POA: Diagnosis not present

## 2023-09-29 DIAGNOSIS — D485 Neoplasm of uncertain behavior of skin: Secondary | ICD-10-CM | POA: Diagnosis not present

## 2023-09-29 DIAGNOSIS — D0462 Carcinoma in situ of skin of left upper limb, including shoulder: Secondary | ICD-10-CM | POA: Diagnosis not present

## 2023-09-29 DIAGNOSIS — R945 Abnormal results of liver function studies: Secondary | ICD-10-CM | POA: Diagnosis not present

## 2023-10-25 DIAGNOSIS — R945 Abnormal results of liver function studies: Secondary | ICD-10-CM | POA: Diagnosis not present

## 2023-10-25 DIAGNOSIS — K219 Gastro-esophageal reflux disease without esophagitis: Secondary | ICD-10-CM | POA: Diagnosis not present

## 2023-10-27 DIAGNOSIS — L986 Other infiltrative disorders of the skin and subcutaneous tissue: Secondary | ICD-10-CM | POA: Diagnosis not present

## 2023-10-27 DIAGNOSIS — L570 Actinic keratosis: Secondary | ICD-10-CM | POA: Diagnosis not present

## 2023-10-27 DIAGNOSIS — D485 Neoplasm of uncertain behavior of skin: Secondary | ICD-10-CM | POA: Diagnosis not present

## 2023-11-07 ENCOUNTER — Other Ambulatory Visit: Payer: Self-pay

## 2024-02-16 DIAGNOSIS — L570 Actinic keratosis: Secondary | ICD-10-CM | POA: Diagnosis not present

## 2024-02-16 DIAGNOSIS — L820 Inflamed seborrheic keratosis: Secondary | ICD-10-CM | POA: Diagnosis not present

## 2024-02-25 ENCOUNTER — Other Ambulatory Visit (HOSPITAL_COMMUNITY): Payer: Self-pay

## 2024-02-25 DIAGNOSIS — I10 Essential (primary) hypertension: Secondary | ICD-10-CM | POA: Diagnosis not present

## 2024-02-25 DIAGNOSIS — E782 Mixed hyperlipidemia: Secondary | ICD-10-CM | POA: Diagnosis not present

## 2024-02-25 DIAGNOSIS — Z0000 Encounter for general adult medical examination without abnormal findings: Secondary | ICD-10-CM | POA: Diagnosis not present

## 2024-02-25 DIAGNOSIS — R7303 Prediabetes: Secondary | ICD-10-CM | POA: Diagnosis not present

## 2024-02-25 MED ORDER — LISINOPRIL-HYDROCHLOROTHIAZIDE 20-25 MG PO TABS
1.0000 | ORAL_TABLET | Freq: Every day | ORAL | 3 refills | Status: AC
  Filled 2024-05-08: qty 90, 90d supply, fill #0

## 2024-03-08 DIAGNOSIS — Z124 Encounter for screening for malignant neoplasm of cervix: Secondary | ICD-10-CM | POA: Diagnosis not present

## 2024-03-08 DIAGNOSIS — Z1231 Encounter for screening mammogram for malignant neoplasm of breast: Secondary | ICD-10-CM | POA: Diagnosis not present

## 2024-03-08 DIAGNOSIS — N951 Menopausal and female climacteric states: Secondary | ICD-10-CM | POA: Diagnosis not present

## 2024-03-08 DIAGNOSIS — Z01419 Encounter for gynecological examination (general) (routine) without abnormal findings: Secondary | ICD-10-CM | POA: Diagnosis not present

## 2024-05-08 ENCOUNTER — Other Ambulatory Visit: Payer: Self-pay
